# Patient Record
Sex: Male | Born: 1985 | Race: Black or African American | Hispanic: No | Marital: Single | Smoking: Current every day smoker
Health system: Southern US, Community
[De-identification: ages and names within clinical notes are randomized; demographics above are authoritative.]

## PROBLEM LIST (undated history)

## (undated) DIAGNOSIS — F419 Anxiety disorder, unspecified: Secondary | ICD-10-CM

## (undated) DIAGNOSIS — I10 Essential (primary) hypertension: Secondary | ICD-10-CM

## (undated) DIAGNOSIS — F32A Depression, unspecified: Secondary | ICD-10-CM

## (undated) HISTORY — PX: NO PAST SURGERIES: SHX2092

---
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
## 2005-09-03 ENCOUNTER — Ambulatory Visit: Payer: Self-pay | Admitting: Family Medicine

## 2006-09-02 DIAGNOSIS — I10 Essential (primary) hypertension: Secondary | ICD-10-CM

## 2006-09-02 DIAGNOSIS — E669 Obesity, unspecified: Secondary | ICD-10-CM | POA: Insufficient documentation

## 2010-06-10 ENCOUNTER — Emergency Department (HOSPITAL_COMMUNITY)
Admission: EM | Admit: 2010-06-10 | Discharge: 2010-06-10 | Payer: Self-pay | Source: Home / Self Care | Admitting: Emergency Medicine

## 2010-06-12 ENCOUNTER — Emergency Department (HOSPITAL_COMMUNITY)
Admission: EM | Admit: 2010-06-12 | Discharge: 2010-06-12 | Payer: Self-pay | Source: Home / Self Care | Admitting: Emergency Medicine

## 2015-04-07 ENCOUNTER — Encounter (HOSPITAL_BASED_OUTPATIENT_CLINIC_OR_DEPARTMENT_OTHER): Payer: Self-pay | Admitting: *Deleted

## 2015-04-07 ENCOUNTER — Emergency Department (HOSPITAL_BASED_OUTPATIENT_CLINIC_OR_DEPARTMENT_OTHER)
Admission: EM | Admit: 2015-04-07 | Discharge: 2015-04-07 | Discharge status: Against Medical Advice | Payer: Self-pay | Attending: Physician Assistant | Admitting: Physician Assistant

## 2015-04-07 DIAGNOSIS — Z720 Tobacco use: Secondary | ICD-10-CM | POA: Insufficient documentation

## 2015-04-07 DIAGNOSIS — E119 Type 2 diabetes mellitus without complications: Secondary | ICD-10-CM | POA: Insufficient documentation

## 2015-04-07 DIAGNOSIS — I10 Essential (primary) hypertension: Secondary | ICD-10-CM | POA: Insufficient documentation

## 2015-04-07 HISTORY — DX: Essential (primary) hypertension: I10

## 2015-04-07 LAB — COMPREHENSIVE METABOLIC PANEL
ALBUMIN: 3.6 g/dL (ref 3.5–5.0)
ALK PHOS: 73 U/L (ref 38–126)
ALT: 18 U/L (ref 17–63)
ANION GAP: 9 (ref 5–15)
AST: 17 U/L (ref 15–41)
BUN: 10 mg/dL (ref 6–20)
CALCIUM: 9 mg/dL (ref 8.9–10.3)
CO2: 29 mmol/L (ref 22–32)
Chloride: 96 mmol/L — ABNORMAL LOW (ref 101–111)
Creatinine, Ser: 1.04 mg/dL (ref 0.61–1.24)
GFR calc Af Amer: >60 mL/min (ref >60)
GFR calc non Af Amer: >60 mL/min (ref >60)
GLUCOSE: 465 mg/dL — AB (ref 65–99)
POTASSIUM: 4 mmol/L (ref 3.5–5.1)
SODIUM: 134 mmol/L — AB (ref 135–145)
Total Bilirubin: 0.5 mg/dL (ref 0.3–1.2)
Total Protein: 7.5 g/dL (ref 6.5–8.1)

## 2015-04-07 LAB — URINALYSIS, ROUTINE W REFLEX MICROSCOPIC
Bilirubin Urine: NEGATIVE
Glucose, UA: >1000 mg/dL — AB
Hgb urine dipstick: NEGATIVE
Ketones, ur: 15 mg/dL — AB
Leukocytes, UA: NEGATIVE
Nitrite: NEGATIVE
Protein, ur: NEGATIVE mg/dL
Specific Gravity, Urine: 1.044 — ABNORMAL HIGH (ref 1.005–1.030)
Urobilinogen, UA: 0.2 mg/dL (ref 0.0–1.0)
pH: 5.5 (ref 5.0–8.0)

## 2015-04-07 LAB — I-STAT VENOUS BLOOD GAS, ED
Acid-Base Excess: 1 mmol/L (ref 0.0–2.0)
Bicarbonate: 25.3 mEq/L — ABNORMAL HIGH (ref 20.0–24.0)
O2 Saturation: 72 %
PCO2 VEN: 37.2 mmHg — AB (ref 45.0–50.0)
PH VEN: 7.441 — AB (ref 7.250–7.300)
PO2 VEN: 36 mmHg (ref 30.0–45.0)
TCO2: 26 mmol/L (ref 0–100)

## 2015-04-07 LAB — URINE MICROSCOPIC-ADD ON

## 2015-04-07 LAB — CBC WITH DIFFERENTIAL/PLATELET
BASOS ABS: 0.1 10*3/uL (ref 0.0–0.1)
BASOS PCT: 1 %
EOS ABS: 0.1 10*3/uL (ref 0.0–0.7)
Eosinophils Relative: 1 %
HCT: 44.8 % (ref 39.0–52.0)
HEMOGLOBIN: 15.1 g/dL (ref 13.0–17.0)
Lymphocytes Relative: 25 %
Lymphs Abs: 1.9 10*3/uL (ref 0.7–4.0)
MCH: 29.8 pg (ref 26.0–34.0)
MCHC: 33.7 g/dL (ref 30.0–36.0)
MCV: 88.5 fL (ref 78.0–100.0)
MONOS PCT: 6 %
Monocytes Absolute: 0.5 10*3/uL (ref 0.1–1.0)
NEUTROS ABS: 5.1 10*3/uL (ref 1.7–7.7)
NEUTROS PCT: 67 %
Platelets: 224 10*3/uL (ref 150–400)
RBC: 5.06 MIL/uL (ref 4.22–5.81)
RDW: 12.8 % (ref 11.5–15.5)
WBC: 7.6 10*3/uL (ref 4.0–10.5)

## 2015-04-07 LAB — CBG MONITORING, ED
GLUCOSE-CAPILLARY: 408 mg/dL — AB (ref 65–99)
GLUCOSE-CAPILLARY: 314 mg/dL — AB (ref 65–99)

## 2015-04-07 MED ORDER — LIDOCAINE HCL (PF) 1 % IJ SOLN
5.0000 mL | Freq: Once | INTRAMUSCULAR | Status: DC
  Filled 2015-04-07: qty 5

## 2015-04-07 MED ORDER — SODIUM CHLORIDE 0.9 % IV BOLUS (SEPSIS)
1000.0000 mL | Freq: Once | INTRAVENOUS | Status: AC
  Administered 2015-04-07: 1000 mL via INTRAVENOUS

## 2015-04-07 NOTE — ED Notes (Signed)
Pt also c/o urgency and frequency, has noted a strong odor from urine also, no burning sensation noted per pt.

## 2015-04-07 NOTE — ED Notes (Signed)
Dr. Corlis Leak at [facility] with [place] for abscess I&D.

## 2015-04-07 NOTE — ED Notes (Signed)
Here for evaluation of "abscess" area per pt statement, at fold at LLQ of abd. Area noted to be red and slightly swollen in color, onset noted approx 3 days ago.

## 2015-04-07 NOTE — ED Notes (Signed)
RN notified of elevated glucose

## 2015-04-07 NOTE — ED Provider Notes (Signed)
CSN: [id number]     Arrival date & time 04/07/15  1330 History  By signing my name below, I, Terrance Branch, attest that this documentation has been prepared under the direction and in the presence of Adira Limburg Randall An, MD. Electronically Signed: Evon Slack, ED Scribe. 04/07/2015. 4:19 PM.     Chief Complaint  Patient presents with  . Abscess    The history is provided by the patient. No language interpreter was used.   HPI Comments: Jden Want is a 29 y.o. male who presents to the Emergency Department complaining of lower abdominal abscess onset 3 days prior. Pt presents with associated swelling and redness. Pt states that he normally squeezes the abscess or places a warm compress for relief. Pt also reports urinary urgency and frequency. Pt denies red streaking, fever, nausea or vomiting.    Past Medical History  Diagnosis Date  . Hypertension    History reviewed. No pertinent past surgical history. No family history on file. Social History  Substance Use Topics  . Smoking status: Current Every Day Smoker    Types: Cigarettes  . Smokeless tobacco: Never Used  . Alcohol Use: Yes     Comment: 1x week    Review of Systems  Constitutional: Negative for fever.  Gastrointestinal: Negative for nausea and vomiting.  Skin: Positive for wound (abscess to left panus. ).  All other systems reviewed and are negative.    Allergies  Review of patient's allergies indicates no known allergies.  Home Medications   Prior to Admission medications   Not on File   BP 146/89 mmHg  Pulse 76  Temp(Src) 98.4 F (36.9 C) (Oral)  Resp 20  SpO2 97%   Physical Exam  Constitutional: He is oriented to person, place, and time. He appears well-developed and well-nourished. No distress.  HENT:  Head: Normocephalic and atraumatic.  Eyes: Conjunctivae and EOM are normal.  Neck: Neck supple. No tracheal deviation present.  Cardiovascular: Normal rate, regular rhythm and normal  heart sounds.   No murmur heard. Pulmonary/Chest: Effort normal and breath sounds normal. No respiratory distress. He has no wheezes. He has no rales.  Abdominal:  Multiple less than 1 cm abscesses and surrounding erythema on panus on left side. multiple ares of induration but no discreet fluid palpated.   Musculoskeletal: Normal range of motion.  Neurological: He is alert and oriented to person, place, and time.  Skin: Skin is warm and dry.  Psychiatric: He has a normal mood and affect. His behavior is normal.  Nursing note and vitals reviewed.   ED Course  Procedures (including critical care time)  Labs Review Labs Reviewed  URINALYSIS, ROUTINE W REFLEX MICROSCOPIC (NOT AT [facility]) - Abnormal; Notable for the following:    Specific Gravity, Urine 1.044 (*)    Glucose, UA >1000 (*)    Ketones, ur 15 (*)    All other components within normal limits  COMPREHENSIVE METABOLIC PANEL - Abnormal; Notable for the following:    Sodium 134 (*)    Chloride 96 (*)    Glucose, Bld 465 (*)    All other components within normal limits  URINE MICROSCOPIC-ADD ON - Abnormal; Notable for the following:    Bacteria, UA MANY (*)    All other components within normal limits  CBG MONITORING, ED - Abnormal; Notable for the following:    Glucose-Capillary 408 (*)    All other components within normal limits  I-STAT VENOUS BLOOD GAS, ED - Abnormal; Notable for the following:  pH, Ven 7.441 (*)    pCO2, Ven 37.2 (*)    Bicarbonate 25.3 (*)    All other components within normal limits  URINE CULTURE  CBC WITH DIFFERENTIAL/PLATELET  BLOOD GAS, VENOUS  BETA-HYDROXYBUTYRIC ACID    Imaging Review No results found.    EKG Interpretation None      MDM   Final diagnoses:  None    Patient is 29 year old male presenting with abscesses. Patient has abscess on his pannus. It appears that he's had 14 or 16 of these are old scarring on his pannis. There is surrounding erythema. Patient also  complains of dysuria. Patient is obese and African-American. I'm concerned he may have diabetes. We'll do a glucose stick as well as I/d and abx for the abscesses.   4:49 PM Glucose shows a glucose of 409. We will get initial labs to rule out DKA. Patient has no primary care doctor.   7:46 PM  Patient having difficulty understanding the concept of diabetes. Would like to admit for diabetes education.  INCISION AND DRAINAGE Performed by: Arlana Hove Consent: Verbal consent obtained. Risks and benefits: risks, benefits and alternatives were discussed Type: abscess  Body area: left pannus  Anesthesia: local infiltration  Incision was made with a scalpel.  Local anesthetic: lidocaine1% w epinephrine  Anesthetic total: 5 ml  Complexity: complex Blunt dissection to break up loculations  Drainage: purulent  Drainage amount: copoius  Packing material: 1/4 in iodoform gauze  Patient tolerance: Patient tolerated the procedure well with no immediate complications.     I personally performed the services described in this documentation, which was scribed in my presence. The recorded information has been reviewed and is accurate.     Aristide Waggle Randall An, MD 04/07/15 2021

## 2015-04-07 NOTE — ED Notes (Signed)
Pt verbalizes wanting to go, planning to go, AMA explained, EDP aware, AMA signed, NSL removed. Pt leaving now despite much coaching, education and encouragement. Encouraged further to return if he "gets worse, changes his mind, has new sx, isn't getting better". Pt verbalizes understanding. Pt dressed self and walked out.

## 2015-04-07 NOTE — ED Notes (Signed)
Denies having fever, nausea or vomiting. States has had these areas before.

## 2015-04-07 NOTE — ED Notes (Signed)
Pt reports painful bump on abdomen. Also urinary frequency without pain

## 2015-04-08 LAB — BETA-HYDROXYBUTYRIC ACID: BETA-HYDROXYBUTYRIC ACID: 0.12 mmol/L (ref 0.05–0.27)

## 2015-04-09 LAB — URINE CULTURE: Special Requests: NORMAL

## 2021-04-11 ENCOUNTER — Encounter (HOSPITAL_COMMUNITY): Payer: Self-pay

## 2021-04-11 ENCOUNTER — Emergency Department (HOSPITAL_COMMUNITY)
Admission: EM | Admit: 2021-04-11 | Discharge: 2021-04-11 | Disposition: A | Discharge status: Home / Self Care | Payer: Self-pay | Attending: Emergency Medicine | Admitting: Emergency Medicine

## 2021-04-11 ENCOUNTER — Emergency Department (HOSPITAL_COMMUNITY): Payer: Self-pay

## 2021-04-11 DIAGNOSIS — J36 Peritonsillar abscess: Secondary | ICD-10-CM | POA: Insufficient documentation

## 2021-04-11 DIAGNOSIS — F17210 Nicotine dependence, cigarettes, uncomplicated: Secondary | ICD-10-CM | POA: Insufficient documentation

## 2021-04-11 DIAGNOSIS — J020 Streptococcal pharyngitis: Secondary | ICD-10-CM

## 2021-04-11 DIAGNOSIS — I10 Essential (primary) hypertension: Secondary | ICD-10-CM | POA: Insufficient documentation

## 2021-04-11 LAB — CBC WITH DIFFERENTIAL/PLATELET
Abs Immature Granulocytes: 0.06 10*3/uL (ref 0.00–0.07)
Basophils Absolute: 0 10*3/uL (ref 0.0–0.1)
Basophils Relative: 0 %
Eosinophils Absolute: 0 10*3/uL (ref 0.0–0.5)
Eosinophils Relative: 0 %
HCT: 46.5 % (ref 39.0–52.0)
Hemoglobin: 15.4 g/dL (ref 13.0–17.0)
Immature Granulocytes: 1 %
Lymphocytes Relative: 12 %
Lymphs Abs: 1.4 10*3/uL (ref 0.7–4.0)
MCH: 30.9 pg (ref 26.0–34.0)
MCHC: 33.1 g/dL (ref 30.0–36.0)
MCV: 93.4 fL (ref 80.0–100.0)
Monocytes Absolute: 0.7 10*3/uL (ref 0.1–1.0)
Monocytes Relative: 6 %
Neutro Abs: 9.6 10*3/uL — ABNORMAL HIGH (ref 1.7–7.7)
Neutrophils Relative %: 81 %
Platelets: 214 10*3/uL (ref 150–400)
RBC: 4.98 MIL/uL (ref 4.22–5.81)
RDW: 12.1 % (ref 11.5–15.5)
WBC: 11.7 10*3/uL — ABNORMAL HIGH (ref 4.0–10.5)
nRBC: 0 % (ref 0.0–0.2)

## 2021-04-11 LAB — BASIC METABOLIC PANEL
Anion gap: 9 (ref 5–15)
BUN: 22 mg/dL — ABNORMAL HIGH (ref 6–20)
CO2: 28 mmol/L (ref 22–32)
Calcium: 9.3 mg/dL (ref 8.9–10.3)
Chloride: 107 mmol/L (ref 98–111)
Creatinine, Ser: 1.25 mg/dL — ABNORMAL HIGH (ref 0.61–1.24)
GFR, Estimated: >60 mL/min (ref >60)
Glucose, Bld: 135 mg/dL — ABNORMAL HIGH (ref 70–99)
Potassium: 4.3 mmol/L (ref 3.5–5.1)
Sodium: 144 mmol/L (ref 135–145)

## 2021-04-11 MED ORDER — DEXAMETHASONE SODIUM PHOSPHATE 10 MG/ML IJ SOLN
10.0000 mg | Freq: Once | INTRAMUSCULAR | Status: AC
  Administered 2021-04-11: 10 mg via INTRAVENOUS
  Filled 2021-04-11: qty 1

## 2021-04-11 MED ORDER — SODIUM CHLORIDE (PF) 0.9 % IJ SOLN
INTRAMUSCULAR | Status: AC
  Filled 2021-04-11: qty 50

## 2021-04-11 MED ORDER — IOHEXOL 350 MG/ML SOLN
80.0000 mL | Freq: Once | INTRAVENOUS | Status: AC | PRN
  Administered 2021-04-11: 80 mL via INTRAVENOUS

## 2021-04-11 NOTE — ED Triage Notes (Signed)
Pt arrived via POV, c/o peritonsillar abcess. States was seen at urgent care today, dx with strep throat, told to go to ED for abscess drainage.

## 2021-04-11 NOTE — ED Provider Notes (Signed)
[facility]-EMERGENCY DEPT Provider Note   CSN: [id number] Arrival date & time: 04/11/21  0429     History Chief Complaint  Patient presents with   Sore Throat   Abscess    Randy Dunn is a 35 y.o. male.   Medical history of hypertension and obesity.  Presents with 6 days of sore throat.  He says it is severe 10 of 10 pain.  Hurts more on the right side.  Pain is constant.  Worse with swallowing.  Denies any fevers or chills.  Says he has difficulty breathing when lying flat.  No difficulty breathing when sitting up.  He was seen by urgent care yesterday.  They diagnosed him with a peritonsillar abscess with strep throat.  They told him he needed to have the abscess drained within 24 hours.  He was given IM Decadron and placed on a 10-day course of Augmentin.  He has a follow-up with ENT this afternoon at 1 PM, however patient presented to the emergency department because he does not think he is going to be able to make this appointment.   Sore Throat Pertinent negatives include no chest pain, no abdominal pain, no headaches and no shortness of breath.  Abscess Associated symptoms: no fever, no headaches, no nausea and no vomiting       Past Medical History:  Diagnosis Date   Hypertension     Patient Active Problem List   Diagnosis Date Noted   OBESITY, NOS 09/02/2006   HYPERTENSION, BENIGN SYSTEMIC 09/02/2006    History reviewed. No pertinent surgical history.     History reviewed. No pertinent family history.  Social History   Tobacco Use   Smoking status: Every Day    Types: Cigarettes   Smokeless tobacco: Never  Substance Use Topics   Alcohol use: Yes    Comment: 1x week   Drug use: Yes    Types: Marijuana    Home Medications Prior to Admission medications   Medication Sig Start Date End Date Taking? Authorizing Provider  amoxicillin (AMOXIL) 500 MG tablet Take 500 mg by mouth 2 (two) times daily.   Yes [provider]     Allergies    Patient has no known allergies.  Review of Systems   Review of Systems  Constitutional:  Negative for chills and fever.  HENT:  Positive for sore throat and trouble swallowing. Negative for congestion and rhinorrhea.   Eyes:  Negative for visual disturbance.  Respiratory:  Negative for cough, chest tightness and shortness of breath.   Cardiovascular:  Negative for chest pain, palpitations and leg swelling.  Gastrointestinal:  Negative for abdominal pain, blood in stool, constipation, diarrhea, nausea and vomiting.  Genitourinary:  Negative for dysuria, flank pain and hematuria.  Musculoskeletal:  Negative for back pain.  Skin:  Negative for rash and wound.  Neurological:  Negative for dizziness, syncope, weakness, light-headedness and headaches.  Psychiatric/Behavioral:  Negative for confusion.   All other systems reviewed and are negative.  Physical Exam Updated Vital Signs BP (!) 165/124 (BP Location: Left Arm)   Pulse (!) 59   Temp 98 F (36.7 C) (Oral)   Resp 16   SpO2 96%   Physical Exam Vitals and nursing note reviewed.  Constitutional:      General: He is not in acute distress.    Appearance: Normal appearance. He is well-developed. He is not ill-appearing, toxic-appearing or diaphoretic.  HENT:     Head: Normocephalic and atraumatic.  Jaw: No trismus or tenderness.     Nose: No nasal deformity, congestion or rhinorrhea.     Mouth/Throat:     Lips: Pink. No lesions.     Mouth: Mucous membranes are moist. No oral lesions.     Pharynx: Oropharynx is clear. Posterior oropharyngeal erythema present. No pharyngeal swelling, oropharyngeal exudate or uvula swelling.     Tonsils: No tonsillar exudate. 1+ on the right. 1+ on the left.     Comments: Uvula is deviated to the right at 90 degrees.  Tonsils do not appear overtly swollen.  I did not see peritonsillar abscess on exam.  Erythema to pharynx. Eyes:     General: Gaze aligned appropriately. No  scleral icterus.       Right eye: No discharge.        Left eye: No discharge.     Conjunctiva/sclera: Conjunctivae normal.     Right eye: Right conjunctiva is not injected. No exudate or hemorrhage.    Left eye: Left conjunctiva is not injected. No exudate or hemorrhage. Neck:     Trachea: Trachea normal.     Comments: Some right-sided tenderness to the right anterior neck. Pulmonary:     Effort: Pulmonary effort is normal. No respiratory distress.  Lymphadenopathy:     Cervical: Cervical adenopathy present.  Skin:    General: Skin is warm and dry.  Neurological:     Mental Status: He is alert and oriented to person, place, and time.  Psychiatric:        Mood and Affect: Mood normal.        Speech: Speech normal.        Behavior: Behavior normal. Behavior is cooperative.    ED Results / Procedures / Treatments   Labs (all labs ordered are listed, but only abnormal results are displayed) Labs Reviewed  CBC WITH DIFFERENTIAL/PLATELET - Abnormal; Notable for the following components:      Result Value   WBC 11.7 (*)    Neutro Abs 9.6 (*)    All other components within normal limits  BASIC METABOLIC PANEL - Abnormal; Notable for the following components:   Glucose, Bld 135 (*)    BUN 22 (*)    Creatinine, Ser 1.25 (*)    All other components within normal limits    EKG None  Radiology CT Soft Tissue Neck W Contrast  Result Date: 04/11/2021 CLINICAL DATA:  Peritonsillar abscess. Patient seen in urgent care and sent to emergency room for drainage of abscess. Strep throat EXAM: CT NECK WITH CONTRAST TECHNIQUE: Multidetector CT imaging of the neck was performed using the standard protocol following the bolus administration of intravenous contrast. CONTRAST:  12mL OMNIPAQUE IOHEXOL 350 MG/ML SOLN COMPARISON:  None. FINDINGS: Pharynx and larynx: Adenoid tissue is hypertrophied without discrete lesion. An ill-defined hypoattenuating area in or adjacent to the right palatine tonsil  measures 7 x 11 x 10 mm. Instinct an incomplete peripheral enhancement is associated. The palatine tonsils are enlarged bilaterally, right greater than left. The airway is narrowed, but patent. Stranding is present in the right parapharyngeal fat. Lingual tonsils are diffusely enlarged. Vallecular is within normal limits. Aryepiglottic folds and piriform sinuses are clear. Vocal cords are midline and symmetric. Trachea is clear. Salivary glands: The submandibular and parotid glands and ducts are within normal limits. Thyroid: Normal Lymph nodes: Enlarged level 2 lymph nodes are present bilaterally, right greater than left. These are likely reactive. Vascular: Normal Limited intracranial: Within normal limits. Visualized orbits: Remote right  orbital blowout fracture is present. Globes and orbits are otherwise within normal limits. Mastoids and visualized paranasal sinuses: The paranasal sinuses and mastoid air cells are clear. Skeleton: Vertebral body heights and alignment are normal. No focal lytic or blastic lesions are present. Upper chest: The lung apices are clear. Thoracic inlet is within normal limits. IMPRESSION: 1. Enlarged palatine tonsils and lingual tonsils compatible with acute pharyngitis. 2. 7 x 11 x 10 mm hypoattenuating area in or adjacent to the right palatine tonsil is concerning for a developing abscess. No definite drainable abscess is yet present. 3. Narrowing of the airway is narrowed, but patent. 4. Enlarged level 2 lymph nodes bilaterally, right greater than left, are likely reactive. 5. Remote right orbital blowout fracture. Electronically Signed   By: Marin Roberts M.D.   On: 04/11/2021 09:55    Procedures Procedures   Medications Ordered in ED Medications  sodium chloride (PF) 0.9 % injection (has no administration in time range)  dexamethasone (DECADRON) injection 10 mg (has no administration in time range)  iohexol (OMNIPAQUE) 350 MG/ML injection 80 mL (80 mLs Intravenous  Contrast Given 04/11/21 0849)    ED Course  I have reviewed the triage vital signs and the nursing notes.  Pertinent labs & imaging results that were available during my care of the patient were reviewed by me and considered in my medical decision making (see chart for details).  Clinical Course as of 04/11/21 1014  Fri Apr 11, 2021  0700 Came from Urgent Care. He was given Decadron injection and Antibiotic course of Augmentin. Strep + [GL]  0813 Potassium: 4.3 [GL]    Clinical Course User Index [GL] Yohana Bartha, Finis Bud, PA-C   MDM Rules/Calculators/A&P                          Patient is a 35 year old male who presents to the emergency department with sore throat for 6 days.  He was diagnosed with a peritonsillar abscess as well strep throat at an urgent care.  You sent to the emergency department for drainage. He is afebrile and his vitals are stable. On my exam, he does have 90 degree uvular deviation to the right, however I do not see an obvious peritonsillar abscess.  He does not have any trismus.  He does not have a potato voice.  He is afebrile. I reviewed his chart that he showed me on his phone for the urgent care office.  He had already been given Decadron injection.  He is started on Augmentin and treat his strep throat and abscess.  I will obtain CBC and BMP.  We will also obtain CT soft tissue neck.  CBC is with mild leukocytosis of 11.7.  Creatinine mildly elevated at 1.25.  Labs are otherwise unremarkable.  CT scan with 7 x 11 x 10 mm hypoattenuating area in the right palatine tonsil which is concerning for developing abscess.  Abscess is not large enough to be drained.  We will give an additional 10 mg of Decadron prior to discharge.  Patient to continue the Augmentin that was prescribed by the urgent care.  He needs to follow-up with ENT I provided that referral.  Plan was discussed with the patient who agrees and acknowledges the next steps.   Final Clinical Impression(s) /  ED Diagnoses Final diagnoses:  Peritonsillar abscess  Strep pharyngitis    Rx / DC Orders ED Discharge Orders  Ordered    Ambulatory referral to ENT        04/11/21 1008             Therese Sarah 04/11/21 1014    Mancel Bale, MD 04/12/21 1212

## 2021-04-11 NOTE — Discharge Instructions (Signed)
Been seen in the emergency department for strep pharyngitis and peritonsillar abscess.  You need to complete the Augmentin antibiotic course that was prescribed to you by the urgent care.  If you are unable to attend the ENT appointment that you have this afternoon, I provided a separate ENT referral for you.  If you do not hear from them within 1-2 days, please reach out to them.

## 2022-03-11 ENCOUNTER — Encounter (HOSPITAL_BASED_OUTPATIENT_CLINIC_OR_DEPARTMENT_OTHER): Payer: Self-pay | Admitting: Emergency Medicine

## 2022-03-11 ENCOUNTER — Other Ambulatory Visit: Payer: Self-pay

## 2022-03-11 ENCOUNTER — Emergency Department (HOSPITAL_BASED_OUTPATIENT_CLINIC_OR_DEPARTMENT_OTHER)
Admission: EM | Admit: 2022-03-11 | Discharge: 2022-03-11 | Disposition: A | Discharge status: Home / Self Care | Payer: Self-pay | Attending: Emergency Medicine | Admitting: Emergency Medicine

## 2022-03-11 ENCOUNTER — Telehealth (HOSPITAL_BASED_OUTPATIENT_CLINIC_OR_DEPARTMENT_OTHER): Payer: Self-pay | Admitting: *Deleted

## 2022-03-11 ENCOUNTER — Ambulatory Visit (HOSPITAL_BASED_OUTPATIENT_CLINIC_OR_DEPARTMENT_OTHER): Payer: Self-pay | Admitting: Family

## 2022-03-11 DIAGNOSIS — I10 Essential (primary) hypertension: Secondary | ICD-10-CM | POA: Insufficient documentation

## 2022-03-11 DIAGNOSIS — Z79899 Other long term (current) drug therapy: Secondary | ICD-10-CM | POA: Insufficient documentation

## 2022-03-11 LAB — COMPREHENSIVE METABOLIC PANEL
ALT: 15 U/L (ref 0–44)
AST: 18 U/L (ref 15–41)
Albumin: 4.1 g/dL (ref 3.5–5.0)
Alkaline Phosphatase: 36 U/L — ABNORMAL LOW (ref 38–126)
Anion gap: 8 (ref 5–15)
BUN: 14 mg/dL (ref 6–20)
CO2: 26 mmol/L (ref 22–32)
Calcium: 9.2 mg/dL (ref 8.9–10.3)
Chloride: 104 mmol/L (ref 98–111)
Creatinine, Ser: 0.94 mg/dL (ref 0.61–1.24)
GFR, Estimated: >60 mL/min (ref >60)
Glucose, Bld: 146 mg/dL — ABNORMAL HIGH (ref 70–99)
Potassium: 3.5 mmol/L (ref 3.5–5.1)
Sodium: 138 mmol/L (ref 135–145)
Total Bilirubin: 0.6 mg/dL (ref 0.3–1.2)
Total Protein: 7.3 g/dL (ref 6.5–8.1)

## 2022-03-11 LAB — CBC WITH DIFFERENTIAL/PLATELET
Abs Immature Granulocytes: 0.01 10*3/uL (ref 0.00–0.07)
Basophils Absolute: 0 10*3/uL (ref 0.0–0.1)
Basophils Relative: 0 %
Eosinophils Absolute: 0.1 10*3/uL (ref 0.0–0.5)
Eosinophils Relative: 1 %
HCT: 44.1 % (ref 39.0–52.0)
Hemoglobin: 15.1 g/dL (ref 13.0–17.0)
Immature Granulocytes: 0 %
Lymphocytes Relative: 35 %
Lymphs Abs: 1.8 10*3/uL (ref 0.7–4.0)
MCH: 30.3 pg (ref 26.0–34.0)
MCHC: 34.2 g/dL (ref 30.0–36.0)
MCV: 88.6 fL (ref 80.0–100.0)
Monocytes Absolute: 0.3 10*3/uL (ref 0.1–1.0)
Monocytes Relative: 5 %
Neutro Abs: 3 10*3/uL (ref 1.7–7.7)
Neutrophils Relative %: 59 %
Platelets: 194 10*3/uL (ref 150–400)
RBC: 4.98 MIL/uL (ref 4.22–5.81)
RDW: 12.5 % (ref 11.5–15.5)
WBC: 5.2 10*3/uL (ref 4.0–10.5)
nRBC: 0 % (ref 0.0–0.2)

## 2022-03-11 MED ORDER — AMLODIPINE BESYLATE 5 MG PO TABS: 5.0000 mg | ORAL_TABLET | Freq: Every day | ORAL | 0 refills | Status: DC

## 2022-03-11 NOTE — ED Provider Notes (Signed)
[facility] EMERGENCY DEPT Provider Note   CSN: [id number] Arrival date & time: 03/11/22  0940     History  Chief Complaint  Patient presents with   Hypertension    Randy Dunn is a 36 y.o. male.  HPI     36 year old male comes in with chief complaint of elevated blood pressure. 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  He sat in the clinic for a few minutes and his blood pressure came down.  Thereafter he went to a weight loss clinic 2 days ago and his blood pressure was 190/140.  He was advised to come to the ER.  He denies any chest pain, shortness of breath, headaches.  Patient has never been diagnosed with hypertension and has never taken BP medications.  He denies any heavy smoking, drinking, substance use disorder.  He did lose 100 pounds at 1 point, but regained 40 of it, prompting him to visit the weight loss clinic.  Home Medications Prior to Admission medications   Medication Sig Start Date End Date Taking? Authorizing Provider  amLODipine (NORVASC) 5 MG tablet Take 1 tablet (5 mg total) by mouth daily. 03/11/22  Yes Derwood Kaplan, MD  amoxicillin (AMOXIL) 500 MG tablet Take 500 mg by mouth 2 (two) times daily.    [provider]      Allergies    Patient has no known allergies.    Review of Systems   Review of Systems  Physical Exam Updated Vital Signs BP (!) 166/94   Pulse 65   Temp 97.7 F (36.5 C) (Temporal)   Resp 13   Ht 5\' 8"  (1.727 m)   Wt 136.1 kg   SpO2 98%   BMI 45.61 kg/m  Physical Exam Vitals and nursing note reviewed.  Constitutional:      Appearance: He is well-developed.  HENT:     Head: Atraumatic.  Cardiovascular:     Rate and Rhythm: Normal rate.  Pulmonary:     Effort: Pulmonary effort is normal.  Musculoskeletal:     Cervical back: Neck supple.  Skin:    General: Skin is warm.  Neurological:     Mental Status: He is alert and oriented  to person, place, and time.     ED Results / Procedures / Treatments   Labs (all labs ordered are listed, but only abnormal results are displayed) Labs Reviewed  COMPREHENSIVE METABOLIC PANEL - Abnormal; Notable for the following components:      Result Value   Glucose, Bld 146 (*)    Alkaline Phosphatase 36 (*)    All other components within normal limits  CBC WITH DIFFERENTIAL/PLATELET    EKG EKG Interpretation  Date/Time:  Wednesday March 11 2022 11:21:49 EDT Ventricular Rate:  68 PR Interval:  154 QRS Duration: 95 QT Interval:  397 QTC Calculation: 423 R Axis:   90 Text Interpretation: Sinus rhythm Prominent P waves, nondiagnostic Borderline right axis deviation No acute changes No old tracing to compare Confirmed by 04-08-2006 [phone]) on 03/11/2022 12:05:38 PM  Radiology No results found.  Procedures Procedures    Medications Ordered in ED Medications - No data to display  ED Course/ Medical Decision Making/ A&P                           Medical Decision Making Amount and/or Complexity of Data Reviewed Labs: ordered.  Risk Prescription drug  management.   This patient presents to the ED with chief complaint(s) of elevated blood pressure, asymptomatic.  The initial plan is to get CBC, BMP and EKG. Most likely this will be a new diagnosis of blood pressure.  Unfortunately patient does not have a PCP.   Independent labs interpretation:  The following labs were independently interpreted: CBC, BMP are reassuring.  No renal failure. Blood glucose is slightly high, but he has not been fasting.   Treatment and Reassessment: Results discussed with the patient.  We will start him on amlodipine 5 mg. Given that he does not have a PCP, will have him follow-up with accelerated hypertension clinic associated with [place]. We have discussed with him some lifestyle changes as well.  Blood pressure log along with instructions on how to take blood pressure  and when to take it have been provided.  Social Determinants of health: Lack of PCP  Final Clinical Impression(s) / ED Diagnoses Final diagnoses:  Essential hypertension    Rx / DC Orders ED Discharge Orders          Ordered    amLODipine (NORVASC) 5 MG tablet  Daily        03/11/22 1201              Derwood Kaplan, MD 03/11/22 1207

## 2022-03-11 NOTE — ED Triage Notes (Signed)
Pt arrives to ED with c/o hypertension. He reports over the past two days his blood pressure has been elevated up to 190 systolic.

## 2022-03-11 NOTE — Telephone Encounter (Signed)
Randy Si, MD  Burnell Blanks, LPN; Derwood Kaplan, MD Juliette Alcide,   Can we please get him in ADV HTN clinic with [facility]?   Thanks!   Patient scheduled with Ronn Melena NP 10/12 Discussed HTN with patient who verbalized not wanting to be on medication Strongly encourage patient to take Amlodipine as prescribed and suggested reviewing DASH diet

## 2022-03-11 NOTE — ED Notes (Signed)
Patient verbalizes understanding of discharge instructions. Opportunity for questioning and answers were provided. Patient discharged from ED.  °

## 2022-03-11 NOTE — Discharge Instructions (Addendum)
You are seen in the ER for elevated blood pressure.  Please start taking the medication that is prescribed.  Read the instructions on the blood pressure to understand the side effects.  Please purchase a blood pressure machine and check your blood pressure every day, twice a day.  Check the blood pressure forcing in the morning before you have had any caffeine intake and then lasting before you go to bed.  On each occasion you should be completely relaxed.  Record your blood pressure and take that sheet to the clinic when you get your appointment.  Provided is the phone number for Dr. Duke Salvia, who runs the hypertension clinic.

## 2022-04-16 ENCOUNTER — Encounter (HOSPITAL_BASED_OUTPATIENT_CLINIC_OR_DEPARTMENT_OTHER): Payer: Self-pay | Admitting: Family

## 2022-04-16 ENCOUNTER — Ambulatory Visit (INDEPENDENT_AMBULATORY_CARE_PROVIDER_SITE_OTHER): Payer: Self-pay | Admitting: Family

## 2022-04-16 VITALS — BP 142/101 | HR 76 | Ht 68.0 in | Wt 310.0 lb

## 2022-04-16 DIAGNOSIS — Z006 Encounter for examination for normal comparison and control in clinical research program: Secondary | ICD-10-CM

## 2022-04-16 DIAGNOSIS — Z6841 Body Mass Index (BMI) 40.0 and over, adult: Secondary | ICD-10-CM

## 2022-04-16 DIAGNOSIS — R0683 Snoring: Secondary | ICD-10-CM

## 2022-04-16 DIAGNOSIS — Z599 Problem related to housing and economic circumstances, unspecified: Secondary | ICD-10-CM

## 2022-04-16 DIAGNOSIS — G4730 Sleep apnea, unspecified: Secondary | ICD-10-CM

## 2022-04-16 DIAGNOSIS — I10 Essential (primary) hypertension: Secondary | ICD-10-CM

## 2022-04-16 MED ORDER — LOSARTAN POTASSIUM 50 MG PO TABS: 50.0000 mg | ORAL_TABLET | Freq: Every day | ORAL | 2 refills | Status: AC

## 2022-04-16 NOTE — Research (Signed)
  Subject Name: Randy Dunn met inclusion and exclusion criteria for the Virtual Care and Social Determinant Interventions for the management of hypertension trial.  The informed consent form, study requirements and expectations were reviewed with the subject by Dr. Oval Linsey and myself. The subject was given the opportunity to read the consent and ask questions. The subject verbalized understanding of the trial requirements.  All questions were addressed prior to the signing of the consent form. The subject agreed to participate in the trial and signed the informed consent. The informed consent was obtained prior to performance of any protocol-specific procedures for the subject.  A copy of the signed informed consent was given to the subject and a copy was placed in the subject's medical record.  Randy Dunn was randomized to Group 2.

## 2022-04-16 NOTE — Progress Notes (Signed)
Advanced [facility]pert[facility] Initial Assessment:    Date:  04/19/2022   ID:  Randy Dunn, DOB 1986-06-23, MRN [id number]  PCP:  Patient, No Pcp Per  Cardiologist:  None  Nephrologist:  Referring MD: No ref. provider found   CC: Hypertension  History of Present Illness:    Randy Dunn is a 36 y.o. male with a hx of hypertension, obesity here to establish care in the Advanced Hypertension Clinic.   He had ED eval 03/11/22 due to hypertension. Noted elevated BP at visit earlier that week for DOT physical as well as at weight loss clinic. Had never taken antihypertensives. In ED was prescribed Amlodipine 5mg  QD. Noted feeling groggy and ED while taking. Previous weight loss of 100 pounds but has regained 30 lbs. Since leaving BP has been 140s/90s at home. Has been out of Amlodipine for a few days.   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 He owns his own truck and has been out of work as truck presently broken. Does not have insurance. Has full custody of his 22 year old daughter who enjoy splaying tennis.   he reports tobacco use daily 0.5 PPD. For exercise he walks at the gym at his apartment complex. he eats at home and does follow low sodium diet.   Notes some chest discomfort described as pressure but not pain. One day occurred while running other times has been more attributed to what he eats. No prior treatment for GERD. He does snore and often wake up feeling fatigued. No prior sleep study.   No palpitations, dyspnea on exertion, orthopnea, PND, edema.   Previous antihypertensives: Amlodipine: groggy, ED, stomach discomfort  Past Medical History:  Diagnosis Date   Hypertension     No past surgical history on file.  Current Medications: Current Meds  Medication Sig   losartan (COZAAR) 50 MG tablet Take 1 tablet (50 mg total) by mouth daily.   [DISCONTINUED] amLODipine (NORVASC) 5 MG tablet Take 1 tablet (5 mg total) by mouth daily.     Allergies:    Patient has no known allergies.   Social History   Socioeconomic History   Marital status: Single    Spouse name: Not on file   Number of children: Not on file   Years of education: Not on file   Highest education level: Not on file  Occupational History   Not on file  Tobacco Use   Smoking status: Every Day    Types: Cigarettes   Smokeless tobacco: Never  Substance and Sexual Activity   Alcohol use: Yes    Comment: 1x week   Drug use: Yes    Types: Marijuana   Sexual activity: Not on file  Other Topics Concern   Not on file  Social History Narrative   Not on file   Social Determinants of Health   Financial Resource Strain: Medium Risk (04/16/2022)   Overall Financial Resource Strain (CARDIA)    Difficulty of Paying Living Expenses: Somewhat hard  Food Insecurity: No Food Insecurity (04/16/2022)   Hunger Vital Sign    Worried About Running Out of Food in the Last Year: Never true    [place] in the Last Year: Never true  Transportation Needs: Unmet Transportation Needs (04/16/2022)   PRAPARE - Transportation    Lack of Transportation (Medical): Yes    Lack of Transportation (Non-Medical): Yes  Physical Activity: Insufficiently Active (04/16/2022)   Exercise Vital Sign    Days of Exercise  per Week: 4 days    Minutes of Exercise per Session: 20 min  Stress: Not on file  Social Connections: Not on file     Family History: The patient's family history includes Hypertension in his mother.  ROS:   Please see the history of present illness.     All other systems reviewed and are negative.  EKGs/Labs/Other Studies Reviewed:    EKG:  No EKG today  Recent Labs: 03/11/2022: ALT 15; BUN 14; Creatinine, Ser 0.94; Hemoglobin 15.1; Platelets 194; Potassium 3.5; Sodium 138   Recent Lipid Panel No results found for: "CHOL", "TRIG", "HDL", "CHOLHDL", "VLDL", "LDLCALC", "LDLDIRECT"  Physical Exam:   VS:  BP (!) 142/101 Comment: right arm  Pulse 76   Ht 5\' 8"   (1.727 m)   Wt (!) 310 lb (140.6 kg)   BMI 47.14 kg/m  , BMI Body mass index is 47.14 kg/m. GENERAL:  Well appearing, overweight HEENT: Pupils equal round and reactive, fundi not visualized, oral mucosa unremarkable NECK:  No jugular venous distention, waveform within normal limits, carotid upstroke brisk and symmetric, no bruits, no thyromegaly LYMPHATICS:  No cervical adenopathy LUNGS:  Clear to auscultation bilaterally HEART:  RRR.  PMI not displaced or sustained,S1 and S2 within normal limits, no S3, no S4, no clicks, no rubs, no murmurs ABD:  Flat, positive bowel sounds normal in frequency in pitch, no bruits, no rebound, no guarding, no midline pulsatile mass, no hepatomegaly, no splenomegaly EXT:  2 plus pulses throughout, no edema, no cyanosis no clubbing SKIN:  No rashes no nodules NEURO:  Cranial nerves II through XII grossly intact, motor grossly intact throughout PSYCH:  Cognitively intact, oriented to person place and time   ASSESSMENT/PLAN:    HTN - BP not at goal <130/80. Did not tolerate Amlodipine well (ED, abdominal discomfort, groggy). Start Losartan 50mg  QD - to get at [facility] for $9. Labs in 1 week BMP, TSH. Heart healthy diet and regular cardiovascular exercise encouraged.  Refer to PREP exercise program. Enrolled in ADV HTN research study.   Snores / Sleep disordered breathing - Notes snoring and non-restorative sleep. Ideally would get sleep study, but cost prohibitive with lack of insurance. Refer to SW.   Obesity - Weight loss via diet and exercise encouraged. Discussed the impact being overweight would have on cardiovascular risk. Refer to PREP exercise program. Ideally would be on GLP1, but cost prohibitive with no insurance.   Financial difficulties - Presently out of work as truck is broken. No insurance. Discussed using [facility]. Refer to SW for financial resources.   Screening for Secondary Hypertension:     04/19/2022   12:47 PM  Causes  Sleep Apnea  Not Screened     - Comments needs sleep study - cost prohibitive  Thyroid Disease Screened  Compliance Screened    Relevant Labs/Studies:    Latest Ref Rng & Units 03/11/2022   10:12 AM 04/11/2021    7:46 AM 04/07/2015    5:05 PM  Basic Labs  Sodium 135 - 145 mmol/L 138  144  134   Potassium 3.5 - 5.1 mmol/L 3.5  4.3  4.0   Creatinine 0.61 - 1.24 mg/dL 06/11/2021  06/07/2015  2.62                     he consents to be monitored in our remote patient monitoring program through [employer].  he will track his blood pressure twice daily and understands that these trends will help  [place] to adjust his medications as needed prior to his next appointment.  he  interested in enrolling in the PREP exercise and nutrition program through the [facility].    Disposition:    FU with APP/PharmD in 2 months and MD in 4 months.   Medication Adjustments/Labs and Tests Ordered: Current medicines are reviewed at length with the patient today.  Concerns regarding medicines are outlined above.  Orders Placed This Encounter  Procedures   Basic metabolic panel   TSH   Amb Referral To Provider Referral Exercise Program (P.R.E.P)   Referral to HRT/VAS Care Navigation   Cantril's Ladder Assessment   Meds ordered this encounter  Medications   losartan (COZAAR) 50 MG tablet    Sig: Take 1 tablet (50 mg total) by mouth daily.    Dispense:  30 tablet    Refill:  2    Signed, Alver Sorrow, NP  04/19/2022 12:47 PM    [facility]

## 2022-04-16 NOTE — Patient Instructions (Addendum)
Medication Instructions:  Your physician has recommended you make the following change in your medication:   Stop: amlodipine   Start: Losartan 50mg - Retail Price at [employer] is $9   Labwork: Please return for Lab work in one week for BMP and TSH. You may come to the...   [facility] (3rd floor) [address] [place], [place] [address]  Open: 8am-Noon and 1pm-4:30pm  Please ring the doorbell on the small table when you exit the elevator and the Lab Tech will come get you  [place] [facility] [address], [place], [place] [address] Open: 8am-1pm, then 2pm-4:30pm   [place]- Please see attached locations sheet stapled to your lab work with address and hours.   Follow-Up: 2 month follow up: 12/14 AT 1:30PM WITH CAITLIN WALKER, NP  4 month follow up: 2/5 AT 2PM WITH DR. Irwin    Referrals:  We have referred you to PREP today, they will reach out to you to get you started.   We have also referred you over to social work today to help with fiancial burdens of no insurance.    Special Instructions:  DASH Eating Plan DASH stands for Dietary Approaches to Stop Hypertension. The DASH eating plan is a healthy eating plan that has been shown to: Reduce high blood pressure (hypertension). Reduce your risk for type 2 diabetes, heart disease, and stroke. Help with weight loss. What are tips for following this plan? Reading food labels Check food labels for the amount of salt (sodium) per serving. Choose foods with less than 5 percent of the Daily Value of sodium. Generally, foods with less than 300 milligrams (mg) of sodium per serving fit into this eating plan. To find whole grains, look for the word "whole" as the first word in the ingredient list. Shopping Buy products labeled as "low-sodium" or "no salt added." Buy fresh foods. Avoid canned foods and pre-made or frozen meals. Cooking Avoid adding salt when cooking. Use  salt-free seasonings or herbs instead of table salt or sea salt. Check with your health care provider or pharmacist before using salt substitutes. Do not fry foods. Cook foods using healthy methods such as baking, boiling, grilling, roasting, and broiling instead. Cook with heart-healthy oils, such as olive, canola, avocado, soybean, or sunflower oil. Meal planning  Eat a balanced diet that includes: 4 or more servings of fruits and 4 or more servings of vegetables each day. Try to fill one-half of your plate with fruits and vegetables. 6-8 servings of whole grains each day. Less than 6 oz (170 g) of lean meat, poultry, or fish each day. A 3-oz (85-g) serving of meat is about the same size as a deck of cards. One egg equals 1 oz (28 g). 2-3 servings of low-fat dairy each day. One serving is 1 cup (237 mL). 1 serving of nuts, seeds, or beans 5 times each week. 2-3 servings of heart-healthy fats. Healthy fats called omega-3 fatty acids are found in foods such as walnuts, flaxseeds, fortified milks, and eggs. These fats are also found in cold-water fish, such as sardines, salmon, and mackerel. Limit how much you eat of: Canned or prepackaged foods. Food that is high in trans fat, such as some fried foods. Food that is high in saturated fat, such as fatty meat. Desserts and other sweets, sugary drinks, and other foods with added sugar. Full-fat dairy products. Do not salt foods before eating. Do not eat more than 4 egg yolks  a week. Try to eat at least 2 vegetarian meals a week. Eat more home-cooked food and less restaurant, buffet, and fast food. Lifestyle When eating at a restaurant, ask that your food be prepared with less salt or no salt, if possible. If you drink alcohol: Limit how much you use to: 0-1 drink a day for women who are not pregnant. 0-2 drinks a day for men. Be aware of how much alcohol is in your drink. In the [place] one drink equals one 12 oz bottle of beer (355 mL), one 5  oz glass of wine (148 mL), or one 1 oz glass of hard liquor (44 mL). General information Avoid eating more than 2,300 mg of salt a day. If you have hypertension, you may need to reduce your sodium intake to 1,500 mg a day. Work with your health care provider to maintain a healthy body weight or to lose weight. Ask what an ideal weight is for you. Get at least 30 minutes of exercise that causes your heart to beat faster (aerobic exercise) most days of the week. Activities may include walking, swimming, or biking. Work with your health care provider or dietitian to adjust your eating plan to your individual calorie needs. What foods should I eat? Fruits All fresh, dried, or frozen fruit. Canned fruit in natural juice (without added sugar). Vegetables Fresh or frozen vegetables (raw, steamed, roasted, or grilled). Low-sodium or reduced-sodium tomato and vegetable juice. Low-sodium or reduced-sodium tomato sauce and tomato paste. Low-sodium or reduced-sodium canned vegetables. Grains Whole-grain or whole-wheat bread. Whole-grain or whole-wheat pasta. Brown rice. [place]. Bulgur. Whole-grain and low-sodium cereals. Pita bread. Low-fat, low-sodium crackers. Whole-wheat flour tortillas. Meats and other proteins Skinless chicken or [place]. Ground chicken or [place]. Pork with fat trimmed off. Fish and seafood. Egg whites. Dried beans, peas, or lentils. Unsalted nuts, nut butters, and seeds. Unsalted canned beans. Lean cuts of beef with fat trimmed off. Low-sodium, lean precooked or cured meat, such as sausages or meat loaves. Dairy Low-fat (1%) or fat-free (skim) milk. Reduced-fat, low-fat, or fat-free cheeses. Nonfat, low-sodium ricotta or cottage cheese. Low-fat or nonfat yogurt. Low-fat, low-sodium cheese. Fats and oils Soft margarine without trans fats. Vegetable oil. Reduced-fat, low-fat, or light mayonnaise and salad dressings (reduced-sodium). Canola, safflower, olive, avocado, soybean, and  sunflower oils. Avocado. Seasonings and condiments Herbs. Spices. Seasoning mixes without salt. Other foods Unsalted popcorn and pretzels. Fat-free sweets. The items listed above may not be a complete list of foods and beverages you can eat. Contact a dietitian for more information. What foods should I avoid? Fruits Canned fruit in a light or heavy syrup. Fried fruit. Fruit in cream or butter sauce. Vegetables Creamed or fried vegetables. Vegetables in a cheese sauce. Regular canned vegetables (not low-sodium or reduced-sodium). Regular canned tomato sauce and paste (not low-sodium or reduced-sodium). Regular tomato and vegetable juice (not low-sodium or reduced-sodium). Angie Fava. Olives. Grains Baked goods made with fat, such as croissants, muffins, or some breads. Dry pasta or rice meal packs. Meats and other proteins Fatty cuts of meat. Ribs. Fried meat. Berniece Salines. Bologna, salami, and other precooked or cured meats, such as sausages or meat loaves. Fat from the back of a pig (fatback). Bratwurst. Salted nuts and seeds. Canned beans with added salt. Canned or smoked fish. Whole eggs or egg yolks. Chicken or [place] with skin. Dairy Whole or 2% milk, cream, and half-and-half. Whole or full-fat cream cheese. Whole-fat or sweetened yogurt. Full-fat cheese. Nondairy creamers. Whipped toppings. Processed cheese and  cheese spreads. Fats and oils Butter. Stick margarine. Lard. Shortening. Ghee. Bacon fat. Tropical oils, such as coconut, palm kernel, or palm oil. Seasonings and condiments Onion salt, garlic salt, seasoned salt, table salt, and sea salt. Worcestershire sauce. Tartar sauce. Barbecue sauce. Teriyaki sauce. Soy sauce, including reduced-sodium. Steak sauce. Canned and packaged gravies. Fish sauce. Oyster sauce. Cocktail sauce. Store-bought horseradish. Ketchup. Mustard. Meat flavorings and tenderizers. Bouillon cubes. Hot sauces. Pre-made or packaged marinades. Pre-made or packaged taco  seasonings. Relishes. Regular salad dressings. Other foods Salted popcorn and pretzels. The items listed above may not be a complete list of foods and beverages you should avoid. Contact a dietitian for more information. Where to find more information National Heart, Lung, and Blood Institute: https://Billi Bright-eaton.com/ American Heart Association: www.heart.org Academy of Nutrition and Dietetics: www.eatright.[place]: www.kidney.org Summary The DASH eating plan is a healthy eating plan that has been shown to reduce high blood pressure (hypertension). It may also reduce your risk for type 2 diabetes, heart disease, and stroke. When on the DASH eating plan, aim to eat more fresh fruits and vegetables, whole grains, lean proteins, low-fat dairy, and heart-healthy fats. With the DASH eating plan, you should limit salt (sodium) intake to 2,300 mg a day. If you have hypertension, you may need to reduce your sodium intake to 1,500 mg a day. Work with your health care provider or dietitian to adjust your eating plan to your individual calorie needs. This information is not intended to replace advice given to you by your health care provider. Make sure you discuss any questions you have with your health care provider. Document Revised: 05/26/2019 Document Reviewed: 05/26/2019 Elsevier Patient Education  [place].

## 2022-04-17 ENCOUNTER — Telehealth: Payer: Self-pay | Admitting: Licensed Clinical Social Worker

## 2022-04-17 NOTE — Telephone Encounter (Signed)
H&V Care Navigation CSW Progress Note  Clinical Social Worker contacted patient by phone to f/u on request for resources as pt is uninsured and does not have a PCP. No answer at [phone]- voicemail full so I could not leave a message. I will re-attempt as able.   Patient is participating in a Managed Medicaid Plan:  No, self pay only.   SDOH Screenings   Food Insecurity: No Food Insecurity (04/16/2022)  Housing: High Risk (04/16/2022)  Transportation Needs: Unmet Transportation Needs (04/16/2022)  Utilities: At Risk (04/16/2022)  Alcohol Screen: Low Risk  (04/16/2022)  Financial Resource Strain: Medium Risk (04/16/2022)  Physical Activity: Insufficiently Active (04/16/2022)  Tobacco Use: High Risk (04/16/2022)   Westley Hummer, MSW, [place]  [phone]- work cell phone (preferred) [phone]- desk phone

## 2022-04-19 ENCOUNTER — Encounter (HOSPITAL_BASED_OUTPATIENT_CLINIC_OR_DEPARTMENT_OTHER): Payer: Self-pay | Admitting: Family

## 2022-04-20 ENCOUNTER — Telehealth: Payer: Self-pay

## 2022-04-20 ENCOUNTER — Telehealth: Payer: Self-pay | Admitting: *Deleted

## 2022-04-20 ENCOUNTER — Telehealth: Payer: Self-pay | Admitting: Licensed Clinical Social Worker

## 2022-04-20 DIAGNOSIS — Z0000 Encounter for general adult medical examination without abnormal findings: Secondary | ICD-10-CM

## 2022-04-20 NOTE — Telephone Encounter (Signed)
Contacted for second time this month regarding PREP Class referral. Unable to leave voice message as it is full.

## 2022-04-20 NOTE — Telephone Encounter (Signed)
Called patient for [employer] call and health coaching per his survey response that he is interested in quitting smoking. Patient's voicemail is full and was not able to leave a message. Sent patient a message via [place] regarding this matter to return my call.    Albertia Carvin Truman [facility]ward [facility] Guide, Health Coach [address], [address] [place] [address] Telephone: [phone] Email: [email or web address]

## 2022-04-20 NOTE — Telephone Encounter (Signed)
H&V Care Navigation CSW Progress Note  Clinical Social Worker contacted patient by phone to f/u on request for resources as pt is uninsured and does not have a PCP. No answer again this morning at [phone]- voicemail full still so I could not leave a message. I will re-attempt again this week.   Patient is participating in a Managed Medicaid Plan:  No, self pay only.     SDOH Screenings   Food Insecurity: No Food Insecurity (04/16/2022)  Housing: High Risk (04/16/2022)  Transportation Needs: Unmet Transportation Needs (04/16/2022)  Utilities: At Risk (04/16/2022)  Alcohol Screen: Low Risk  (04/16/2022)  Financial Resource Strain: Medium Risk (04/16/2022)  Physical Activity: Insufficiently Active (04/16/2022)  Tobacco Use: High Risk (04/19/2022)    Westley Hummer, MSW, LCSW Clinical Social Worker [place]  [phone]- work cell phone (preferred) [phone]- desk phone

## 2022-04-22 ENCOUNTER — Telehealth: Payer: Self-pay

## 2022-04-22 ENCOUNTER — Telehealth: Payer: Self-pay | Admitting: Licensed Clinical Social Worker

## 2022-04-22 DIAGNOSIS — Z0000 Encounter for general adult medical examination without abnormal findings: Secondary | ICD-10-CM

## 2022-04-22 NOTE — Telephone Encounter (Signed)
H&V Care Navigation CSW Progress Note  Clinical Social Worker contacted patient by phone to f/u on for resources as pt is uninsured and does not have a PCP. No answer again this morning at [phone]- voicemail full still so I could not leave a message. I have asked Amy, Health Coach, to send him both my information and the information for PREP program as we both have been unable to get in touch with him via [place]. I remain available.   Patient is participating in a Managed Medicaid Plan:  No, self pay only.   SDOH Screenings   Food Insecurity: No Food Insecurity (04/16/2022)  Housing: High Risk (04/16/2022)  Transportation Needs: Unmet Transportation Needs (04/16/2022)  Utilities: At Risk (04/16/2022)  Alcohol Screen: Low Risk  (04/16/2022)  Financial Resource Strain: Medium Risk (04/16/2022)  Physical Activity: Insufficiently Active (04/16/2022)  Tobacco Use: High Risk (04/19/2022)   Westley Hummer, MSW, LCSW Clinical Social Worker [place]  [phone]- work cell phone (preferred) [phone]- desk phone

## 2022-04-22 NOTE — Telephone Encounter (Signed)
Called patient to conduct Welcome Call and offer health coaching per survey response for smoking cessation. Patient requested that his morning prompt time be changed. Patient declined health coaching for smoking cessation. Patient explained that he is missing readings due to leaving his device in his girlfriend's vehicle and will regain access to it today. Patient's morning prompt time has been changed. Patient had no further concerns or questions.    Garry Bochicchio Truman Hayward [facility] Guide, Health Coach [address] [place] [address] Telephone: [phone] Email: [email or web address]

## 2022-04-27 ENCOUNTER — Telehealth: Payer: Self-pay | Admitting: Licensed Clinical Social Worker

## 2022-04-27 NOTE — Telephone Encounter (Signed)
H&V Care Navigation CSW Progress Note  Clinical Social Worker contacted patient by phone to f/u again on referral for resources. Was able to reach pt this morning at  [phone]. Introduced self, role, reason for call. Pt shares that this isnt a good time to speak and asks I call him back in a few hours. I share that I have attempted to call him three times and his voicemail is full therefore I am unable to leave messages. I requested the pt clear out some messages/give me a call back when his schedule permits. I remain available.   Patient is participating in a Managed Medicaid Plan:  No, self pay only.   SDOH Screenings   Food Insecurity: No Food Insecurity (04/16/2022)  Housing: High Risk (04/16/2022)  Transportation Needs: Unmet Transportation Needs (04/16/2022)  Utilities: At Risk (04/16/2022)  Alcohol Screen: Low Risk  (04/16/2022)  Financial Resource Strain: Medium Risk (04/16/2022)  Physical Activity: Insufficiently Active (04/16/2022)  Tobacco Use: High Risk (04/19/2022)   Westley Hummer, MSW, LCSW Clinical Social Worker [place]  [phone]- work cell phone (preferred) [phone]- desk phone

## 2022-04-28 ENCOUNTER — Telehealth: Payer: Self-pay | Admitting: Licensed Clinical Social Worker

## 2022-04-28 NOTE — Telephone Encounter (Signed)
H&V Care Navigation CSW Progress Note  Clinical Social Worker contacted patient by phone to f/u since no return call yesterday. Pt voicemail at [phone] is still full, unable to leave a message. Will mail application for [employer] . Have been unable to engage pt at this time for community resources despite multiple calls, and speaking directly with pt regarding assistance.   Patient is participating in a Managed Medicaid Plan:  No, self pay.   SDOH Screenings   Food Insecurity: No Food Insecurity (04/16/2022)  Housing: High Risk (04/16/2022)  Transportation Needs: Unmet Transportation Needs (04/16/2022)  Utilities: At Risk (04/16/2022)  Alcohol Screen: Low Risk  (04/16/2022)  Financial Resource Strain: Medium Risk (04/16/2022)  Physical Activity: Insufficiently Active (04/16/2022)  Tobacco Use: High Risk (04/19/2022)   Westley Hummer, MSW, LCSW Clinical Social Worker [place]  [phone]- work cell phone (preferred) [phone]- desk phone

## 2022-04-30 ENCOUNTER — Telehealth: Payer: Self-pay

## 2022-04-30 DIAGNOSIS — Z0000 Encounter for general adult medical examination without abnormal findings: Secondary | ICD-10-CM

## 2022-04-30 NOTE — Telephone Encounter (Signed)
Called patient because of missing readings in [place] to determine any challenges the patient may be having with his device. Unable to leave patient a message because voicemail is full. Sent patient a message via [employer] concerning this issue and my contact information to return call.    Dorla Guizar Truman Hayward [facility] Guide, Health Coach [address], [address] [place] [address] Telephone: [phone] Email: [email or web address]

## 2022-05-12 ENCOUNTER — Telehealth (HOSPITAL_BASED_OUTPATIENT_CLINIC_OR_DEPARTMENT_OTHER): Payer: Self-pay

## 2022-05-12 NOTE — Telephone Encounter (Addendum)
Attempted call to patient, no answer, left message for patient to call the office in regards to his blood pressure.   ----- Message from Avelino Leeds sent at 05/12/2022  1:24 PM EST ----- Regarding: [employer] - Noncompliance Hi team,  I wanted to let you know that I have tried to engage the patient with checking his bp twice daily. The social worker has tried to contact him. I was able to reach him once and he stated that he had to get it out of his girlfriends truck. He checked it 19 days ago and have not responded to any of my messages via [place]. We have been unable to leave messages due to his voicemail being full. Not sure what to do about this patient. Please advise.  Thanks, Amy

## 2022-05-14 ENCOUNTER — Encounter (HOSPITAL_BASED_OUTPATIENT_CLINIC_OR_DEPARTMENT_OTHER): Payer: Self-pay

## 2022-05-14 NOTE — Telephone Encounter (Signed)
2nd call attempt, no answer, left 2nd VM. Letter mailed to patient asking him to call us.

## 2022-05-21 ENCOUNTER — Telehealth: Payer: Self-pay

## 2022-05-21 DIAGNOSIS — Z0000 Encounter for general adult medical examination without abnormal findings: Secondary | ICD-10-CM

## 2022-05-21 NOTE — Telephone Encounter (Signed)
Called patient regarding [employer] noncompliance with checking his bp twice daily. Patient did not answer. Left message for patient to return call to discuss any issues with device or needing his prompt times changed.    Ronne Stefanski Nedra Hai [facility] Guide, Health Coach [address], [address] [place] [address] Telephone: [phone] Email: [email or web address]

## 2022-06-01 ENCOUNTER — Telehealth: Payer: Self-pay

## 2022-06-01 DIAGNOSIS — Z0000 Encounter for general adult medical examination without abnormal findings: Secondary | ICD-10-CM

## 2022-06-01 NOTE — Telephone Encounter (Signed)
Called patient due to noncompliance with bp checks in [employer]. Patient did not answer and mailbox is full. Was unable to leave a message at this time.    Devaughn Savant Nedra Hai [facility] Guide, Health Coach [address], [address] [place] [address] Telephone: [phone] Email: [email or web address]

## 2022-06-15 ENCOUNTER — Telehealth: Payer: Self-pay

## 2022-06-15 DIAGNOSIS — Z0000 Encounter for general adult medical examination without abnormal findings: Secondary | ICD-10-CM

## 2022-06-15 NOTE — Telephone Encounter (Signed)
Called patient regarding non-compliance with checking his blood pressure twice daily with [employer]. Patient did not answer. Left message for patient to return call to provide update if his prompt times works with his schedule or if he is having connectivity issues.    Justyne Roell Nedra Hai [facility] Guide, Health Coach [address] [place] [address] Telephone: [phone] Email: [email or web address]

## 2022-06-18 ENCOUNTER — Encounter (HOSPITAL_BASED_OUTPATIENT_CLINIC_OR_DEPARTMENT_OTHER): Payer: Self-pay

## 2022-06-18 ENCOUNTER — Ambulatory Visit (HOSPITAL_BASED_OUTPATIENT_CLINIC_OR_DEPARTMENT_OTHER): Payer: Self-pay | Admitting: Family
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
## 2022-08-10 ENCOUNTER — Ambulatory Visit (HOSPITAL_BASED_OUTPATIENT_CLINIC_OR_DEPARTMENT_OTHER): Payer: Self-pay | Admitting: Cardiovascular Disease

## 2022-08-10 NOTE — Progress Notes (Deleted)
Advanced Hypertension Clinic Initial Assessment:    Date:  08/10/2022   ID:  Randy Dunn, DOB 12/13/1985, MRN [id number]  PCP:  Patient, No Pcp Per  Cardiologist:  None  Nephrologist:  Referring MD: No ref. provider found   CC: Hypertension  History of Present Illness:    Randy Dunn is a 37 y.o. male with a hx of hypertension, tobacco use, and obesity here for follow up.  He was first seen in advanced hypertension clinic 04/2022.  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  He was seen in the ED 03/2022 and was started on amlodipine.  At the time of his initial visit he had been out of his medication for 3 days.  He was not feeling well on amlodipine and it was discontinued.  He was started on losartan and referred to the PREP program at the [facility].  He was encouraged to work on lifestyle modification including smoking cessation and increasing exercise.  He also enrolled in the remote patient monitoring study.  He has not been seen since that time and has not responded to multiple phone calls from our study coordinators, social workers, or care guide.   Previous antihypertensives: Amlodipine  Past Medical History:  Diagnosis Date   Hypertension     No past surgical history on file.  Current Medications: No outpatient medications have been marked as taking for the 08/10/22 encounter (Appointment) with Skeet Latch, MD.     Allergies:   Patient has no known allergies.   Social History   Socioeconomic History   Marital status: Single    Spouse name: Not on file   Number of children: Not on file   Years of education: Not on file   Highest education level: Not on file  Occupational History   Not on file  Tobacco Use   Smoking status: Every Day    Types: Cigarettes   Smokeless tobacco: Never  Substance and Sexual Activity   Alcohol use: Yes    Comment: 1x week   Drug use: Yes    Types: Marijuana   Sexual activity: Not on file  Other  Topics Concern   Not on file  Social History Narrative   Not on file   Social Determinants of Health   Financial Resource Strain: Medium Risk (04/16/2022)   Overall Financial Resource Strain (CARDIA)    Difficulty of Paying Living Expenses: Somewhat hard  Food Insecurity: No Food Insecurity (04/16/2022)   Hunger Vital Sign    Worried About Running Out of Food in the Last Year: Never true    Ran Out of Food in the Last Year: Never true  Transportation Needs: Unmet Transportation Needs (04/16/2022)   PRAPARE - Hydrologist (Medical): Yes    Lack of Transportation (Non-Medical): Yes  Physical Activity: Insufficiently Active (04/16/2022)   Exercise Vital Sign    Days of Exercise per Week: 4 days    Minutes of Exercise per Session: 20 min  Stress: Not on file  Social Connections: Not on file     Family History: The patient's ***family history includes Hypertension in his mother.  ROS:   Please see the history of present illness.    *** All other systems reviewed and are negative.  EKGs/Labs/Other Studies Reviewed:    EKG:  EKG is *** ordered today.  The ekg ordered today demonstrates ***  Recent Labs: 03/11/2022: ALT 15; BUN 14; Creatinine, Ser 0.94; Hemoglobin  15.1; Platelets 194; Potassium 3.5; Sodium 138   Recent Lipid Panel No results found for: "CHOL", "TRIG", "HDL", "CHOLHDL", "VLDL", "LDLCALC", "LDLDIRECT"  Physical Exam:   VS:  There were no vitals taken for this visit. , BMI There is no height or weight on file to calculate BMI. GENERAL:  Well appearing HEENT: Pupils equal round and reactive, fundi not visualized, oral mucosa unremarkable NECK:  No jugular venous distention, waveform within normal limits, carotid upstroke brisk and symmetric, no bruits, no thyromegaly LYMPHATICS:  No cervical adenopathy LUNGS:  Clear to auscultation bilaterally HEART:  RRR.  PMI not displaced or sustained,S1 and S2 within normal limits, no S3, no S4, no  clicks, no rubs, *** murmurs ABD:  Flat, positive bowel sounds normal in frequency in pitch, no bruits, no rebound, no guarding, no midline pulsatile mass, no hepatomegaly, no splenomegaly EXT:  2 plus pulses throughout, no edema, no cyanosis no clubbing SKIN:  No rashes no nodules NEURO:  Cranial nerves II through XII grossly intact, motor grossly intact throughout PSYCH:  Cognitively intact, oriented to person place and time   ASSESSMENT/PLAN:    No problem-specific Assessment & Plan notes found for this encounter.   Screening for Secondary Hypertension: { Click here to document screening for secondary causes of HTN  :[id number]    04/19/2022   12:47 PM  Causes  Sleep Apnea Not Screened     - Comments needs sleep study - cost prohibitive  Thyroid Disease Screened  Compliance Screened    Relevant Labs/Studies:    Latest Ref Rng & Units 03/11/2022   10:12 AM 04/11/2021    7:46 AM 04/07/2015    5:05 PM  Basic Labs  Sodium 135 - 145 mmol/L 138  144  134   Potassium 3.5 - 5.1 mmol/L 3.5  4.3  4.0   Creatinine 0.61 - 1.24 mg/dL 0.94  1.25  1.04       Disposition:    FU with MD/PharmD in {gen number [id number] {Days to years:10300}    Medication Adjustments/Labs and Tests Ordered: Current medicines are reviewed at length with the patient today.  Concerns regarding medicines are outlined above.  No orders of the defined types were placed in this encounter.  No orders of the defined types were placed in this encounter.    Signed, Skeet Latch, MD  08/10/2022 1:11 PM    [facility]

## 2023-02-16 ENCOUNTER — Emergency Department (HOSPITAL_COMMUNITY): Payer: No Typology Code available for payment source

## 2023-02-16 ENCOUNTER — Emergency Department (HOSPITAL_COMMUNITY)
Admission: EM | Admit: 2023-02-16 | Discharge: 2023-02-17 | Disposition: A | Discharge status: Home / Self Care | Payer: No Typology Code available for payment source | Attending: Emergency Medicine | Admitting: Emergency Medicine

## 2023-02-16 ENCOUNTER — Other Ambulatory Visit: Payer: Self-pay

## 2023-02-16 DIAGNOSIS — S61206A Unspecified open wound of right little finger without damage to nail, initial encounter: Secondary | ICD-10-CM | POA: Diagnosis not present

## 2023-02-16 DIAGNOSIS — S30811A Abrasion of abdominal wall, initial encounter: Secondary | ICD-10-CM | POA: Insufficient documentation

## 2023-02-16 DIAGNOSIS — S299XXA Unspecified injury of thorax, initial encounter: Secondary | ICD-10-CM | POA: Insufficient documentation

## 2023-02-16 DIAGNOSIS — M25531 Pain in right wrist: Secondary | ICD-10-CM | POA: Insufficient documentation

## 2023-02-16 DIAGNOSIS — S00211A Abrasion of right eyelid and periocular area, initial encounter: Secondary | ICD-10-CM | POA: Insufficient documentation

## 2023-02-16 DIAGNOSIS — R791 Abnormal coagulation profile: Secondary | ICD-10-CM | POA: Insufficient documentation

## 2023-02-16 DIAGNOSIS — Y92481 Parking lot as the place of occurrence of the external cause: Secondary | ICD-10-CM | POA: Insufficient documentation

## 2023-02-16 DIAGNOSIS — S3991XA Unspecified injury of abdomen, initial encounter: Secondary | ICD-10-CM | POA: Diagnosis not present

## 2023-02-16 DIAGNOSIS — M542 Cervicalgia: Secondary | ICD-10-CM | POA: Diagnosis not present

## 2023-02-16 DIAGNOSIS — S0990XA Unspecified injury of head, initial encounter: Secondary | ICD-10-CM | POA: Diagnosis not present

## 2023-02-16 DIAGNOSIS — M546 Pain in thoracic spine: Secondary | ICD-10-CM | POA: Diagnosis not present

## 2023-02-16 DIAGNOSIS — S61204A Unspecified open wound of right ring finger without damage to nail, initial encounter: Secondary | ICD-10-CM | POA: Diagnosis not present

## 2023-02-16 DIAGNOSIS — S6991XA Unspecified injury of right wrist, hand and finger(s), initial encounter: Secondary | ICD-10-CM | POA: Diagnosis present

## 2023-02-16 DIAGNOSIS — S61209A Unspecified open wound of unspecified finger without damage to nail, initial encounter: Secondary | ICD-10-CM

## 2023-02-16 LAB — I-STAT CHEM 8, ED
BUN: 13 mg/dL (ref 6–20)
Calcium, Ion: 1.07 mmol/L — ABNORMAL LOW (ref 1.15–1.40)
Chloride: 106 mmol/L (ref 98–111)
Creatinine, Ser: 0.9 mg/dL (ref 0.61–1.24)
Glucose, Bld: 120 mg/dL — ABNORMAL HIGH (ref 70–99)
HCT: 45 % (ref 39.0–52.0)
Hemoglobin: 15.3 g/dL (ref 13.0–17.0)
Potassium: 3.9 mmol/L (ref 3.5–5.1)
Sodium: 138 mmol/L (ref 135–145)
TCO2: 24 mmol/L (ref 22–32)

## 2023-02-16 LAB — CBC
HCT: 46.1 % (ref 39.0–52.0)
Hemoglobin: 15.2 g/dL (ref 13.0–17.0)
MCH: 30.6 pg (ref 26.0–34.0)
MCHC: 33 g/dL (ref 30.0–36.0)
MCV: 92.8 fL (ref 80.0–100.0)
Platelets: 220 10*3/uL (ref 150–400)
RBC: 4.97 MIL/uL (ref 4.22–5.81)
RDW: 13.1 % (ref 11.5–15.5)
WBC: 7.6 10*3/uL (ref 4.0–10.5)
nRBC: 0 % (ref 0.0–0.2)

## 2023-02-16 LAB — COMPREHENSIVE METABOLIC PANEL
ALT: 16 U/L (ref 0–44)
AST: 20 U/L (ref 15–41)
Albumin: 3.6 g/dL (ref 3.5–5.0)
Alkaline Phosphatase: 43 U/L (ref 38–126)
Anion gap: 12 (ref 5–15)
BUN: 12 mg/dL (ref 6–20)
CO2: 22 mmol/L (ref 22–32)
Calcium: 8.8 mg/dL — ABNORMAL LOW (ref 8.9–10.3)
Chloride: 104 mmol/L (ref 98–111)
Creatinine, Ser: 0.92 mg/dL (ref 0.61–1.24)
GFR, Estimated: >60 mL/min (ref >60)
Glucose, Bld: 119 mg/dL — ABNORMAL HIGH (ref 70–99)
Potassium: 3.9 mmol/L (ref 3.5–5.1)
Sodium: 138 mmol/L (ref 135–145)
Total Bilirubin: 0.5 mg/dL (ref 0.3–1.2)
Total Protein: 6.8 g/dL (ref 6.5–8.1)

## 2023-02-16 LAB — SAMPLE TO BLOOD BANK

## 2023-02-16 LAB — PROTIME-INR
INR: 0.9 (ref 0.8–1.2)
Prothrombin Time: 12.3 seconds (ref 11.4–15.2)

## 2023-02-16 LAB — URINALYSIS, ROUTINE W REFLEX MICROSCOPIC
Bilirubin Urine: NEGATIVE
Glucose, UA: NEGATIVE mg/dL
Hgb urine dipstick: NEGATIVE
Ketones, ur: NEGATIVE mg/dL
Leukocytes,Ua: NEGATIVE
Nitrite: NEGATIVE
Protein, ur: NEGATIVE mg/dL
Specific Gravity, Urine: 1.013 (ref 1.005–1.030)
pH: 7 (ref 5.0–8.0)

## 2023-02-16 LAB — ETHANOL: Alcohol, Ethyl (B): <10 mg/dL (ref <10)

## 2023-02-16 LAB — I-STAT CG4 LACTIC ACID, ED: Lactic Acid, Venous: 1.3 mmol/L (ref 0.5–1.9)

## 2023-02-16 MED ORDER — LIDOCAINE HCL (PF) 1 % IJ SOLN
INTRAMUSCULAR | Status: AC
  Filled 2023-02-16: qty 5

## 2023-02-16 MED ORDER — IOHEXOL 350 MG/ML SOLN
75.0000 mL | Freq: Once | INTRAVENOUS | Status: AC | PRN
  Administered 2023-02-16: 75 mL via INTRAVENOUS

## 2023-02-16 MED ORDER — NAPROXEN 375 MG PO TABS: 375.0000 mg | ORAL_TABLET | Freq: Two times a day (BID) | ORAL | 0 refills | Status: DC

## 2023-02-16 MED ORDER — TETANUS-DIPHTH-ACELL PERTUSSIS 5-2.5-18.5 LF-MCG/0.5 IM SUSY
0.5000 mL | PREFILLED_SYRINGE | Freq: Once | INTRAMUSCULAR | Status: DC
  Filled 2023-02-16: qty 0.5

## 2023-02-16 MED ORDER — FENTANYL CITRATE PF 50 MCG/ML IJ SOSY
50.0000 ug | PREFILLED_SYRINGE | Freq: Once | INTRAMUSCULAR | Status: DC
  Filled 2023-02-16: qty 1

## 2023-02-16 MED ORDER — LIDOCAINE-EPINEPHRINE (PF) 2 %-1:200000 IJ SOLN
10.0000 mL | Freq: Once | INTRAMUSCULAR | Status: AC
  Administered 2023-02-16: 10 mL via INTRADERMAL
  Filled 2023-02-16: qty 20

## 2023-02-16 MED ORDER — ACETAMINOPHEN 500 MG PO TABS
1000.0000 mg | ORAL_TABLET | Freq: Once | ORAL | Status: AC
  Administered 2023-02-16: 1000 mg via ORAL
  Filled 2023-02-16: qty 2

## 2023-02-16 MED ORDER — LIDOCAINE HCL (PF) 1 % IJ SOLN
5.0000 mL | Freq: Once | INTRAMUSCULAR | Status: AC
  Administered 2023-02-16: 5 mL via INTRADERMAL

## 2023-02-16 MED ORDER — LIDOCAINE HCL (PF) 1 % IJ SOLN
5.0000 mL | Freq: Once | INTRAMUSCULAR | Status: AC
  Administered 2023-02-16: 5 mL via INTRADERMAL
  Filled 2023-02-16: qty 5

## 2023-02-16 MED ORDER — ONDANSETRON HCL 4 MG/2ML IJ SOLN
4.0000 mg | Freq: Once | INTRAMUSCULAR | Status: DC
  Filled 2023-02-16: qty 2

## 2023-02-16 MED ORDER — METHOCARBAMOL 500 MG PO TABS: 500.0000 mg | ORAL_TABLET | Freq: Three times a day (TID) | ORAL | 0 refills | Status: DC | PRN
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
## 2023-02-16 NOTE — Discharge Instructions (Signed)
You will need to call Dr. Bari Edward office tomorrow morning.  Please set up an appointment to follow-up.\  Keep your splint on until you see Dr. Orlan Leavens. Get help right away if you have bleeding through your gauze or pain out of proportion.  Please expect that you will be very sore over the next few days.
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
## 2023-02-16 NOTE — ED Notes (Signed)
Spoke with patient and family advised only two visitors.  Advised they can switch out.  Family cursing at this nurse about no one seeing him yet.  Advised we are working very hard and a provider will be over shortly. One family said that wasn't good enough and we are only worried about visitors.  Spoke with PA who will  be going to see this patient.

## 2023-02-17 NOTE — ED Notes (Signed)
Pt ambulatory in hallway.  This RN asked pt to wait a moment for discharge paperwork.  Pt provided with AVS.  Education complete; all questions answered.  Pt leaving ED in stable condition at this time, ambulatory with all belongings.  Pt leaving prior to obtaining discharge vitals.

## 2023-02-17 NOTE — Progress Notes (Signed)
Orthopedic Tech Progress Note Patient Details:  Randy Dunn [birth year] [id number]  Ortho Devices Type of Ortho Device: Ulna gutter splint Ortho Device/Splint Location: RUE Ortho Device/Splint Interventions: Ordered, Application, Adjustment   Post Interventions Patient Tolerated: Well  Al Decant 02/17/2023, 12:09 AM

## 2023-02-23 ENCOUNTER — Other Ambulatory Visit: Payer: Self-pay

## 2023-02-23 ENCOUNTER — Encounter (HOSPITAL_COMMUNITY): Payer: Self-pay | Admitting: Orthopedic Surgery

## 2023-02-23 NOTE — Progress Notes (Signed)
SDW call  Patient was given pre-op instructions over the phone. Patient verbalized understanding of instructions provided.     PCP - Denies Cardiologist -  Pulmonary:    PPM/ICD - denies Device Orders - n/a Rep Notified - n/a   Chest x-ray - 02/16/2023 EKG -  03/11/2022 Stress Test - ECHO -  Cardiac Cath -   Sleep Study/sleep apnea/CPAP: denies  Non-diabetic   Blood Thinner Instructions: denies Aspirin Instructions:denies   ERAS Protcol - Yes, clear fluids until noon   COVID TEST- n/a    Anesthesia review: No   Patient denies shortness of breath, fever, cough and chest pain over the phone call  Your procedure is scheduled on Wednesday February 24, 2023  Report to [facility] Main Entrance "A" at  1230 PM, then check in with the Admitting office.  Call this number if you have problems the morning of surgery:  [phone]   If you have any questions prior to your surgery date call [phone]: Open Monday-Friday 8am-4pm If you experience any cold or flu symptoms such as cough, fever, chills, shortness of breath, etc. between now and your scheduled surgery, please notify us at the above number    Remember:  Do not eat after midnight the night before your surgery  You may drink clear liquids until  noon the day of your surgery.   Clear liquids allowed are: Water, Non-Citrus Juices (without pulp), Carbonated Beverages, Clear Tea, Black Coffee ONLY (NO MILK, CREAM OR POWDERED CREAMER of any kind), and Gatorade   Take these medicines as needed the morning of surgery with A SIP OF WATER:  Robaxin  As of today, STOP taking any Aspirin (unless otherwise instructed by your surgeon) Aleve, Naproxen, Ibuprofen, Motrin, Advil, Goody's, BC's, all herbal medications, fish oil, and all vitamins.

## 2023-02-24 ENCOUNTER — Ambulatory Visit (HOSPITAL_BASED_OUTPATIENT_CLINIC_OR_DEPARTMENT_OTHER): Payer: No Typology Code available for payment source | Admitting: Anesthesiology

## 2023-02-24 ENCOUNTER — Encounter (HOSPITAL_COMMUNITY)
Admission: RE | Disposition: A | Discharge status: Home / Self Care | Payer: Self-pay | Source: Home / Self Care | Attending: Orthopedic Surgery

## 2023-02-24 ENCOUNTER — Ambulatory Visit (HOSPITAL_COMMUNITY): Payer: No Typology Code available for payment source | Admitting: Anesthesiology

## 2023-02-24 ENCOUNTER — Other Ambulatory Visit: Payer: Self-pay

## 2023-02-24 ENCOUNTER — Ambulatory Visit (HOSPITAL_COMMUNITY)
Admission: RE | Admit: 2023-02-24 | Discharge: 2023-02-24 | Disposition: A | Discharge status: Home / Self Care | Payer: No Typology Code available for payment source | Attending: Orthopedic Surgery | Admitting: Orthopedic Surgery

## 2023-02-24 ENCOUNTER — Encounter (HOSPITAL_COMMUNITY): Payer: Self-pay | Admitting: Orthopedic Surgery

## 2023-02-24 DIAGNOSIS — S61401A Unspecified open wound of right hand, initial encounter: Secondary | ICD-10-CM

## 2023-02-24 DIAGNOSIS — F17210 Nicotine dependence, cigarettes, uncomplicated: Secondary | ICD-10-CM | POA: Insufficient documentation

## 2023-02-24 DIAGNOSIS — S61411A Laceration without foreign body of right hand, initial encounter: Secondary | ICD-10-CM | POA: Insufficient documentation

## 2023-02-24 HISTORY — DX: Depression, unspecified: F32.A

## 2023-02-24 HISTORY — PX: INCISION AND DRAINAGE OF WOUND: SHX1803

## 2023-02-24 HISTORY — DX: Anxiety disorder, unspecified: F41.9

## 2023-02-24 SURGERY — IRRIGATION AND DEBRIDEMENT WOUND
Anesthesia: General | Site: Hand | Laterality: Right

## 2023-02-24 MED ORDER — ACETAMINOPHEN 10 MG/ML IV SOLN
1000.0000 mg | Freq: Once | INTRAVENOUS | Status: DC | PRN
  Administered 2023-02-24: 1000 mg via INTRAVENOUS

## 2023-02-24 MED ORDER — GLYCOPYRROLATE PF 0.2 MG/ML IJ SOSY
PREFILLED_SYRINGE | INTRAMUSCULAR | Status: AC
  Filled 2023-02-24: qty 1

## 2023-02-24 MED ORDER — FENTANYL CITRATE (PF) 250 MCG/5ML IJ SOLN
INTRAMUSCULAR | Status: AC
  Filled 2023-02-24: qty 5

## 2023-02-24 MED ORDER — GLYCOPYRROLATE 0.2 MG/ML IJ SOLN
INTRAMUSCULAR | Status: DC | PRN
  Administered 2023-02-24: .2 mg via INTRAVENOUS

## 2023-02-24 MED ORDER — DEXAMETHASONE SODIUM PHOSPHATE 10 MG/ML IJ SOLN
INTRAMUSCULAR | Status: DC | PRN
  Administered 2023-02-24: 6 mg via INTRAVENOUS

## 2023-02-24 MED ORDER — ONDANSETRON HCL 4 MG/2ML IJ SOLN
INTRAMUSCULAR | Status: AC
  Filled 2023-02-24: qty 2

## 2023-02-24 MED ORDER — OXYCODONE HCL 5 MG PO TABS
5.0000 mg | ORAL_TABLET | Freq: Once | ORAL | Status: AC | PRN
  Administered 2023-02-24: 5 mg via ORAL

## 2023-02-24 MED ORDER — EPHEDRINE SULFATE-NACL 50-0.9 MG/10ML-% IV SOSY
PREFILLED_SYRINGE | INTRAVENOUS | Status: DC | PRN
  Administered 2023-02-24: 10 mg via INTRAVENOUS

## 2023-02-24 MED ORDER — ORAL CARE MOUTH RINSE: 15.0000 mL | Freq: Once | OROMUCOSAL | Status: AC

## 2023-02-24 MED ORDER — PROPOFOL 10 MG/ML IV BOLUS
INTRAVENOUS | Status: AC
  Filled 2023-02-24: qty 20

## 2023-02-24 MED ORDER — ONDANSETRON HCL 4 MG/2ML IJ SOLN
INTRAMUSCULAR | Status: DC | PRN
  Administered 2023-02-24: 4 mg via INTRAVENOUS

## 2023-02-24 MED ORDER — PHENYLEPHRINE 80 MCG/ML (10ML) SYRINGE FOR IV PUSH (FOR BLOOD PRESSURE SUPPORT)
PREFILLED_SYRINGE | INTRAVENOUS | Status: DC | PRN
  Administered 2023-02-24: 80 ug via INTRAVENOUS

## 2023-02-24 MED ORDER — FENTANYL CITRATE (PF) 250 MCG/5ML IJ SOLN
INTRAMUSCULAR | Status: DC | PRN
  Administered 2023-02-24: 100 ug via INTRAVENOUS
  Administered 2023-02-24: 50 ug via INTRAVENOUS
  Administered 2023-02-24: 100 ug via INTRAVENOUS

## 2023-02-24 MED ORDER — EPHEDRINE 5 MG/ML INJ
INTRAVENOUS | Status: AC
  Filled 2023-02-24: qty 5

## 2023-02-24 MED ORDER — PHENYLEPHRINE 80 MCG/ML (10ML) SYRINGE FOR IV PUSH (FOR BLOOD PRESSURE SUPPORT)
PREFILLED_SYRINGE | INTRAVENOUS | Status: AC
  Filled 2023-02-24: qty 10

## 2023-02-24 MED ORDER — OXYCODONE HCL 5 MG PO TABS
ORAL_TABLET | ORAL | Status: AC
  Filled 2023-02-24: qty 1

## 2023-02-24 MED ORDER — FENTANYL CITRATE (PF) 100 MCG/2ML IJ SOLN
INTRAMUSCULAR | Status: AC
  Filled 2023-02-24: qty 2

## 2023-02-24 MED ORDER — MIDAZOLAM HCL 2 MG/2ML IJ SOLN
INTRAMUSCULAR | Status: AC
  Filled 2023-02-24: qty 2

## 2023-02-24 MED ORDER — DEXMEDETOMIDINE HCL IN NACL 80 MCG/20ML IV SOLN
INTRAVENOUS | Status: DC | PRN
  Administered 2023-02-24 (×2): 10 ug via INTRAVENOUS

## 2023-02-24 MED ORDER — BUPIVACAINE HCL (PF) 0.5 % IJ SOLN
INTRAMUSCULAR | Status: AC
  Filled 2023-02-24: qty 30

## 2023-02-24 MED ORDER — CHLORHEXIDINE GLUCONATE 0.12 % MT SOLN
OROMUCOSAL | Status: AC
  Administered 2023-02-24: 15 mL via OROMUCOSAL
  Filled 2023-02-24: qty 15

## 2023-02-24 MED ORDER — CEFAZOLIN SODIUM-DEXTROSE 2-4 GM/100ML-% IV SOLN
INTRAVENOUS | Status: AC
  Filled 2023-02-24: qty 100

## 2023-02-24 MED ORDER — LIDOCAINE 2% (20 MG/ML) 5 ML SYRINGE
INTRAMUSCULAR | Status: AC
  Filled 2023-02-24: qty 5

## 2023-02-24 MED ORDER — MIDAZOLAM HCL 2 MG/2ML IJ SOLN
INTRAMUSCULAR | Status: DC | PRN
  Administered 2023-02-24: 2 mg via INTRAVENOUS

## 2023-02-24 MED ORDER — LACTATED RINGERS IV SOLN: INTRAVENOUS | Status: DC

## 2023-02-24 MED ORDER — ACETAMINOPHEN 500 MG PO TABS: 1000.0000 mg | ORAL_TABLET | Freq: Once | ORAL | Status: DC | PRN

## 2023-02-24 MED ORDER — DEXAMETHASONE SODIUM PHOSPHATE 10 MG/ML IJ SOLN
INTRAMUSCULAR | Status: AC
  Filled 2023-02-24: qty 1

## 2023-02-24 MED ORDER — OXYCODONE HCL 5 MG/5ML PO SOLN: 5.0000 mg | Freq: Once | ORAL | Status: AC | PRN

## 2023-02-24 MED ORDER — PROPOFOL 10 MG/ML IV BOLUS
INTRAVENOUS | Status: DC | PRN
  Administered 2023-02-24: 200 mg via INTRAVENOUS
  Administered 2023-02-24: 70 mg via INTRAVENOUS

## 2023-02-24 MED ORDER — ACETAMINOPHEN 160 MG/5ML PO SOLN: 1000.0000 mg | Freq: Once | ORAL | Status: DC | PRN

## 2023-02-24 MED ORDER — DEXMEDETOMIDINE HCL IN NACL 80 MCG/20ML IV SOLN
INTRAVENOUS | Status: AC
  Filled 2023-02-24: qty 20

## 2023-02-24 MED ORDER — CHLORHEXIDINE GLUCONATE 0.12 % MT SOLN: 15.0000 mL | Freq: Once | OROMUCOSAL | Status: AC

## 2023-02-24 MED ORDER — BUPIVACAINE-EPINEPHRINE (PF) 0.25% -1:200000 IJ SOLN
INTRAMUSCULAR | Status: AC
  Filled 2023-02-24: qty 30

## 2023-02-24 MED ORDER — CEFAZOLIN IN SODIUM CHLORIDE 3-0.9 GM/100ML-% IV SOLN
3.0000 g | INTRAVENOUS | Status: AC
  Administered 2023-02-24: 3 g via INTRAVENOUS

## 2023-02-24 MED ORDER — ACETAMINOPHEN 10 MG/ML IV SOLN
INTRAVENOUS | Status: AC
  Filled 2023-02-24: qty 100

## 2023-02-24 MED ORDER — FENTANYL CITRATE (PF) 100 MCG/2ML IJ SOLN
25.0000 ug | INTRAMUSCULAR | Status: DC | PRN
  Administered 2023-02-24 (×2): 50 ug via INTRAVENOUS

## 2023-02-24 MED ORDER — LIDOCAINE 2% (20 MG/ML) 5 ML SYRINGE
INTRAMUSCULAR | Status: DC | PRN
  Administered 2023-02-24: 60 mg via INTRAVENOUS

## 2023-02-24 SURGICAL SUPPLY — 40 items
ANCH SUT 2-0 3/8 CRC MIC FT 4 (Anchor) ×1 IMPLANT
ANCHOR FT CORKSCREW MICRO 2-0 (Anchor) IMPLANT
BAG COUNTER SPONGE SURGICOUNT (BAG) ×2 IMPLANT
BAG SPNG CNTER NS LX DISP (BAG)
BNDG ADH 5X4 AIR PERM ELC (GAUZE/BANDAGES/DRESSINGS) ×2
BNDG CMPR 9X4 STRL LF SNTH (GAUZE/BANDAGES/DRESSINGS) ×1
BNDG COHESIVE 4X5 WHT NS (GAUZE/BANDAGES/DRESSINGS) IMPLANT
BNDG ELASTIC 4X5.8 VLCR STR LF (GAUZE/BANDAGES/DRESSINGS) ×2 IMPLANT
BNDG ESMARK 4X9 LF (GAUZE/BANDAGES/DRESSINGS) IMPLANT
BNDG GAUZE DERMACEA FLUFF 4 (GAUZE/BANDAGES/DRESSINGS) ×2 IMPLANT
BNDG GZE DERMACEA 4 6PLY (GAUZE/BANDAGES/DRESSINGS) ×1
CORD BIPOLAR FORCEPS 12FT (ELECTRODE) IMPLANT
CUFF TOURN SGL QUICK 18X4 (TOURNIQUET CUFF) ×2 IMPLANT
DRAPE SURG 17X11 SM STRL (DRAPES) ×4 IMPLANT
DRSG EMULSION OIL 3X3 NADH (GAUZE/BANDAGES/DRESSINGS) ×2 IMPLANT
ELECT REM PT RETURN 9FT ADLT (ELECTROSURGICAL) ×1
ELECTRODE REM PT RTRN 9FT ADLT (ELECTROSURGICAL) ×2 IMPLANT
GAUZE PAD ABD 8X10 STRL (GAUZE/BANDAGES/DRESSINGS) ×2 IMPLANT
GAUZE SPONGE 4X4 12PLY STRL (GAUZE/BANDAGES/DRESSINGS) ×2 IMPLANT
GLOVE SURG ORTHO 8.0 STRL STRW (GLOVE) ×2 IMPLANT
GOWN STRL REUS W/ TWL XL LVL3 (GOWN DISPOSABLE) ×2 IMPLANT
GOWN STRL REUS W/TWL XL LVL3 (GOWN DISPOSABLE) ×1
IV NS IRRIG 3000ML ARTHROMATIC (IV SOLUTION) ×2 IMPLANT
KIT BASIN OR (CUSTOM PROCEDURE TRAY) ×2 IMPLANT
MANIFOLD NEPTUNE II (INSTRUMENTS) ×2 IMPLANT
PACK ORTHO EXTREMITY (CUSTOM PROCEDURE TRAY) ×2 IMPLANT
PAD CAST 4YDX4 CTTN HI CHSV (CAST SUPPLIES) ×2 IMPLANT
PADDING CAST COTTON 4X4 STRL (CAST SUPPLIES) ×2
SET CYSTO W/LG BORE CLAMP LF (SET/KITS/TRAYS/PACK) ×2 IMPLANT
SOAP 2 % CHG 4 OZ (WOUND CARE) ×2 IMPLANT
SPLINT FIBERGLASS 3X12 (CAST SUPPLIES) IMPLANT
SUT PROLENE 3 0 PS 2 (SUTURE) IMPLANT
SUT VIC AB 1 CT1 27 (SUTURE)
SUT VIC AB 1 CT1 27XBRD ANTBC (SUTURE) IMPLANT
SUT VIC AB 2-0 CT1 27 (SUTURE)
SUT VIC AB 2-0 CT1 27XBRD (SUTURE) IMPLANT
SUT VIC AB 2-0 CT2 27 (SUTURE) IMPLANT
SYR 20ML LL LF (SYRINGE) ×2 IMPLANT
SYR CONTROL 10ML LL (SYRINGE) ×2 IMPLANT
TOWEL GREEN STERILE (TOWEL DISPOSABLE) ×2 IMPLANT
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
## 2023-02-24 NOTE — Op Note (Signed)
PREOPERATIVE DIAGNOSIS: Right hand laceration with possible tendon involvement  POSTOPERATIVE DIAGNOSIS: Same  ATTENDING SURGEON: Dr. Bradly Bienenstock Scrubbed and present for the entire procedure  ASSISTANT SURGEON: None  ANESTHESIA: General Via LMA  OPERATIVE PROCEDURE: Right hand laceration exploration and flexor sheath drainage Right small finger metacarpal phalangeal joint radial collateral ligament repair Right small finger volar plate repair Right small finger radial digital nerve neurolysis and decompression. Right hand laceration repaired, 7 cm. Right hand interosseous muscle repair.  Small hand intrinsic muscle repair.   IMPLANTS: Arthrex mini corkscrew anchor  EBL: Minimal  RADIOGRAPHIC INTERPRETATION: None  SURGICAL INDICATIONS: Patient is a right-hand-dominant gentleman who sustained the injury to his right small finger webspace and near amputation of the small finger.  Patient was seen evaluate in the office and recommend undergo the above procedure.  The risks of surgery include but not limited to bleeding infection damage nearby nerves arteries or tendons loss of motion of the wrist and digits incomplete relief of symptoms and need for further surgical invention.  SURGICAL TECHNIQUE: The patient was prepped identified in the preoperative holding area marked with a prominent marker made on the right small finger to indicate correct operative site.  Patient then brought back the operating room placed supine on the anesthesia room table where the general anesthetic was administered.  Preoperative antibiotics were given prior to any skin incision.  A well-padded tourniquet was then placed on the right brachium and set with the appropriate drape.  The right upper extremities then prepped and draped in normal sterile fashion.  A timeout was called the correct site identified procedure then began.  Attention was then turned to the right hand.  A 7 cm laceration was extended  proximally and distally.  The laceration extended through the webspace.  Dissection carried down through the skin and subtendinous tissue after the tourniquet insufflated.  The flexor sheath was explored.  The patient did have continuity of the FDS and FDP.  The patient had avulsion of the radial artery to the small finger.  This was avulsed just distal to the takeoff from the common digital artery to the ring and small finger.  The patient's radial digital nerve was then carefully explored and this was in continuity.  Careful nerve decompression and neurolysis was then done through the traumatic zone freeing the nerve.  Following this deep dissection carried all the way down where the patient did have the injury to the volar plate.  The volar plate was then opened up from a volar position.  The radial collateral ligament had been completely torn.  Suture anchor was then placed within the metacarpal head recess.  Following this the limbs of the suture were delivered through the collateral ligament complex.  The volar plate was then repaired as well.  Joint had been thoroughly irrigated.  Following this with good closure over the volar plate as well as open repair of the palmar interosseous musculature the wound was then thoroughly irrigated.  Tourniquet deflated.  There is good hemostasis and good perfusion of the fingertip.  Skin was then closed using use simple 3-0 Prolene sutures.  Adaptic dressing sterile compressive bandage then applied.  The patient placed in well-padded ulnar gutter splint.  Patient taken recovery room in good condition extubated  POSTOPERATIVE PLAN: Patient discharged home.  See him back the office in approximately 2 weeks for wound check suture removal x-rays application of ulnar gutter cast.  Plan to see him back at the 5 to 6-week mark  for repeat radiographs and then begin an outpatient therapy regimen for collateral ligament repair protocol.

## 2023-02-24 NOTE — Anesthesia Preprocedure Evaluation (Signed)
Anesthesia Evaluation  Patient identified by MRN, date of birth, ID band Patient awake    Reviewed: Allergy & Precautions, NPO status , Patient's Chart, lab work & pertinent test results  History of Anesthesia Complications Negative for: history of anesthetic complications  Airway Mallampati: III  TM Distance: >3 FB Neck ROM: Full    Dental  (+) Teeth Intact, Dental Advisory Given,    Pulmonary neg shortness of breath, neg sleep apnea, neg recent URI, Current Smoker   breath sounds clear to auscultation       Cardiovascular hypertension, (-) angina (-) Past MI and (-) CHF  Rhythm:Regular     Neuro/Psych  PSYCHIATRIC DISORDERS Anxiety Depression    negative neurological ROS     GI/Hepatic negative GI ROS, Neg liver ROS,,,  Endo/Other  negative endocrine ROS    Renal/GU negative Renal ROS     Musculoskeletal  Right hand laceration with tendon and nerve involvement   Abdominal   Peds  Hematology negative hematology ROS (+)   Anesthesia Other Findings   Reproductive/Obstetrics                             Anesthesia Physical Anesthesia Plan  ASA: 2  Anesthesia Plan: General   Post-op Pain Management: Ofirmev IV (intra-op)* and Toradol IV (intra-op)*   Induction: Intravenous  PONV Risk Score and Plan: 1 and Ondansetron and Dexamethasone  Airway Management Planned: LMA and Oral ETT  Additional Equipment: None  Intra-op Plan:   Post-operative Plan: Extubation in OR  Informed Consent: I have reviewed the patients History and Physical, chart, labs and discussed the procedure including the risks, benefits and alternatives for the proposed anesthesia with the patient or authorized representative who has indicated his/her understanding and acceptance.     Dental advisory given  Plan Discussed with: CRNA  Anesthesia Plan Comments:        Anesthesia Quick Evaluation

## 2023-02-24 NOTE — Transfer of Care (Signed)
Immediate Anesthesia Transfer of Care Note  Patient: Randy Dunn  Procedure(s) Performed: Right hand laceration exploration and flexor sheath drainage (Right: Hand)  Patient Location: PACU  Anesthesia Type:General  Level of Consciousness: awake, alert , and oriented  Airway & Oxygen Therapy: Patient Spontanous Breathing and Patient connected to face mask  Post-op Assessment: Report given to RN and Post -op Vital signs reviewed and stable  Post vital signs: Reviewed and stable  Last Vitals:  Vitals Value Taken Time  BP 183/116 02/24/23 1731  Temp 36.3 C 02/24/23 1720  Pulse 68 02/24/23 1733  Resp 12 02/24/23 1733  SpO2 96 % 02/24/23 1733  Vitals shown include unfiled device data.  Last Pain:  Vitals:   02/24/23 1720  TempSrc:   PainSc: 10-Worst pain ever      Patients Stated Pain Goal: 3 (02/24/23 1720)  Complications: No notable events documented.

## 2023-02-24 NOTE — Discharge Instructions (Signed)
KEEP BANDAGE CLEAN AND DRY CALL OFFICE FOR F/U APPT [phone] KEEP HAND ELEVATED ABOVE HEART OK TO APPLY ICE TO OPERATIVE AREA CONTACT OFFICE IF ANY WORSENING PAIN OR CONCERNS.  RX SENT TO [facility], OXYCODONE

## 2023-02-25 ENCOUNTER — Encounter (HOSPITAL_COMMUNITY): Payer: Self-pay | Admitting: Orthopedic Surgery

## 2023-02-25 NOTE — Anesthesia Postprocedure Evaluation (Signed)
Anesthesia Post Note  Patient: Randy Dunn  Procedure(s) Performed: Right hand laceration exploration and flexor sheath drainage (Right: Hand)     Patient location during evaluation: PACU Anesthesia Type: General Level of consciousness: awake and alert Pain management: pain level controlled Vital Signs Assessment: post-procedure vital signs reviewed and stable Respiratory status: spontaneous breathing, nonlabored ventilation, respiratory function stable and patient connected to nasal cannula oxygen Cardiovascular status: blood pressure returned to baseline and stable Postop Assessment: no apparent nausea or vomiting Anesthetic complications: no   No notable events documented.  Last Vitals:  Vitals:   02/24/23 1815 02/24/23 1830  BP: (!) 157/122 (!) 167/109  Pulse: 66 60  Resp: 14 15  Temp:  (!) 36.3 C  SpO2: 100% 99%    Last Pain:  Vitals:   02/24/23 1830  TempSrc:   PainSc: 7                  Mariann Barter

## 2023-02-27 ENCOUNTER — Encounter (HOSPITAL_BASED_OUTPATIENT_CLINIC_OR_DEPARTMENT_OTHER): Payer: Self-pay | Admitting: Emergency Medicine

## 2023-02-27 ENCOUNTER — Other Ambulatory Visit: Payer: Self-pay

## 2023-02-27 ENCOUNTER — Emergency Department (HOSPITAL_BASED_OUTPATIENT_CLINIC_OR_DEPARTMENT_OTHER)
Admission: EM | Admit: 2023-02-27 | Discharge: 2023-02-27 | Disposition: A | Discharge status: Home / Self Care | Payer: Self-pay | Attending: Emergency Medicine | Admitting: Emergency Medicine

## 2023-02-27 DIAGNOSIS — M79641 Pain in right hand: Secondary | ICD-10-CM | POA: Insufficient documentation

## 2023-02-27 DIAGNOSIS — G8918 Other acute postprocedural pain: Secondary | ICD-10-CM | POA: Insufficient documentation

## 2023-02-27 DIAGNOSIS — M79601 Pain in right arm: Secondary | ICD-10-CM | POA: Insufficient documentation

## 2023-02-27 NOTE — Discharge Instructions (Signed)
You can double your dose of oxycodone to 2 tablets every 6 hours if needed. Add ibuprofen 600 mg (3 over-the-counter strength tablets) every 6 hours. Keep the hand elevated to reduce pain as well.   Call Dr. Glenna Durand office on Monday to schedule a time to be seen for recheck and splint care. Return to the ED with any new or worsening symptoms.

## 2023-02-27 NOTE — ED Notes (Signed)
 RN reviewed discharge instructions with pt. Pt verbalized understanding and had no further questions. VSS upon discharge.  
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
## 2023-02-27 NOTE — ED Triage Notes (Signed)
Pt drove self today

## 2023-02-27 NOTE — ED Notes (Signed)
ED Provider at bedside. 
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
## 2023-03-31 ENCOUNTER — Encounter: Payer: Self-pay | Admitting: Occupational Therapy

## 2023-03-31 ENCOUNTER — Ambulatory Visit: Payer: Self-pay | Admitting: Occupational Therapy

## 2023-03-31 ENCOUNTER — Other Ambulatory Visit: Payer: Self-pay

## 2023-03-31 ENCOUNTER — Ambulatory Visit: Payer: Self-pay | Attending: Orthopedic Surgery | Admitting: Occupational Therapy

## 2023-03-31 DIAGNOSIS — R208 Other disturbances of skin sensation: Secondary | ICD-10-CM | POA: Insufficient documentation

## 2023-03-31 DIAGNOSIS — M25641 Stiffness of right hand, not elsewhere classified: Secondary | ICD-10-CM | POA: Insufficient documentation

## 2023-03-31 DIAGNOSIS — M6281 Muscle weakness (generalized): Secondary | ICD-10-CM | POA: Insufficient documentation

## 2023-03-31 DIAGNOSIS — R601 Generalized edema: Secondary | ICD-10-CM | POA: Insufficient documentation

## 2023-03-31 DIAGNOSIS — M25541 Pain in joints of right hand: Secondary | ICD-10-CM | POA: Insufficient documentation

## 2023-03-31 DIAGNOSIS — R278 Other lack of coordination: Secondary | ICD-10-CM | POA: Insufficient documentation

## 2023-03-31 NOTE — Patient Instructions (Signed)
Protective splint wearing schedule:  Wear splint at ALL times except for hygiene care (May remove splint for exercises and then immediately place back on ONLY if directed by the therapist). Do not use your hand to pick things up, grip, lift or carry at this time.  PURPOSE of splint:  To prevent movement and for protection until your ligament and volar plate injuries to your small finger can heal  CARE OF SPLINT:  Keep splint away from heat sources including: stove, radiator or furnace, or a car in sunlight. The splint can melt and will no longer fit you properly  Keep away from pets and children  Clean the splint with rubbing alcohol as needed.  * During this time, make sure you also clean your hand/arm as instructed by your therapist and/or perform dressing changes as needed. Then dry hand/arm completely before replacing splint. (When cleaning hand/arm, keep it immobilized in same position until splint is replaced)  PRECAUTIONS/POTENTIAL PROBLEMS: *If you notice or experience increased pain, swelling, numbness, or a lingering reddened area from the splint: Contact your therapist immediately by calling [phone]. You must wear the splint for protection, but we will get you scheduled for adjustments as quickly as possible.  (If only straps or hooks need to be replaced and NO adjustments to the splint need to be made, just call the office ahead and let them know you are coming in)  If you have any medical concerns or signs of infection, please call your doctor immediately  Access Code: [email or web address] URL: [email or web address] Date: 03/31/2023 Prepared by: Mariam Dollar  Remove your splint for exercises and replace immediately afterwards. Do not move your small finger from side to side at any time, as this will put stress on your repaired radial collateral ligament at your MCP joint and your volar plate repair. Keep your fingers from moving from side to side (no ulnar or radial  deviation) during exercises.  Exercises - Seated Digit Tendon Gliding  - 4-6 x daily - 7 x weekly - 1 sets - 10 reps - 3-5 hold - Tendon Gliding Fist Series: Neutral Hand Position Progression  - 4-6 x daily - 7 x weekly - 1 sets - 10 reps - 3-5 hold - Hand Active Tabletop Fist Series  - 4-6 x daily - 7 x weekly - 1 sets - 10 reps - 3-5 hold Do not force finger bending of straightening. Go as far as you can using the muscles of your injured hand only, then hold for a count of 3-5 seconds for a stretch. Replace your splint when done  You may massage the scar on your hand to assist with desensitizing and increasing flexibility, 2-3x/day for .

## 2023-04-07 ENCOUNTER — Ambulatory Visit: Payer: Self-pay | Attending: Orthopedic Surgery | Admitting: Occupational Therapy

## 2023-04-14 ENCOUNTER — Ambulatory Visit: Payer: Self-pay | Admitting: Occupational Therapy

## 2023-04-15 IMAGING — CT CT NECK W/ CM
3 of 4 series · 13 of 33 positions shown, 16 images · IV contrast (omnipaque)
Comparison: None.

CLINICAL DATA: Peritonsillar abscess. Patient seen in [HOSPITAL]
and sent to emergency room for drainage of abscess. Strep throat

EXAM:
CT NECK WITH CONTRAST
TECHNIQUE: Multidetector CT imaging of the neck was performed using the
standard protocol following the bolus administration of intravenous
contrast.
CONTRAST:  80mL OMNIPAQUE IOHEXOL 350 MG/ML SOLN

[Series 2: axial neck · axial · 0.54mm/px · z∈[-223,-73]mm · 5 of 113 slices shown, 7 images]
[im 19/113  soft-tissue]
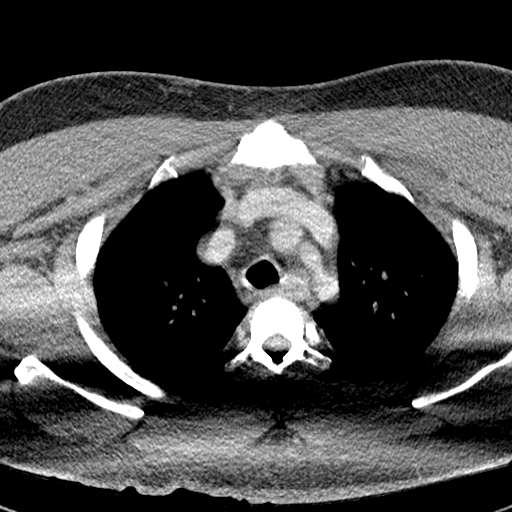
[im 19/113  bone]
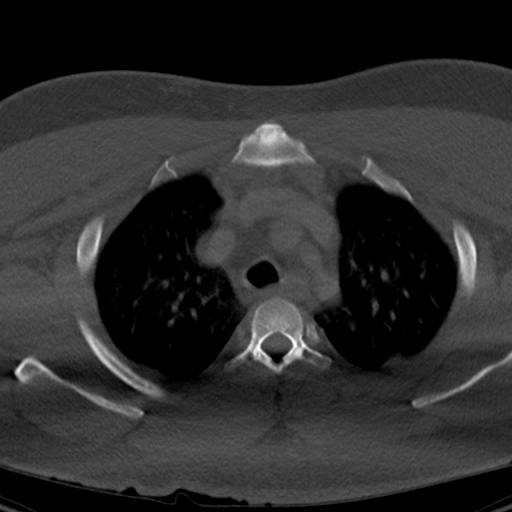
[im 38/113  bone]
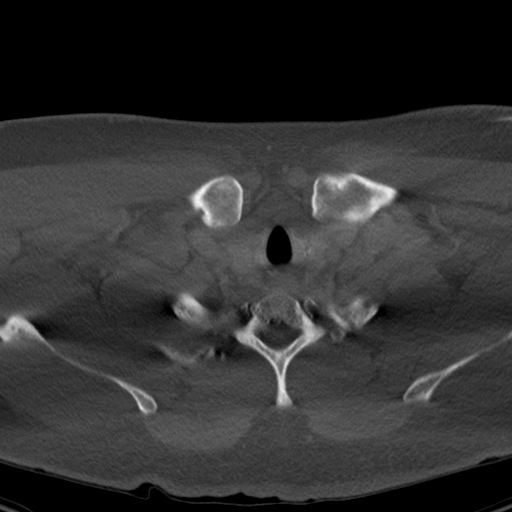
[im 57/113  bone]
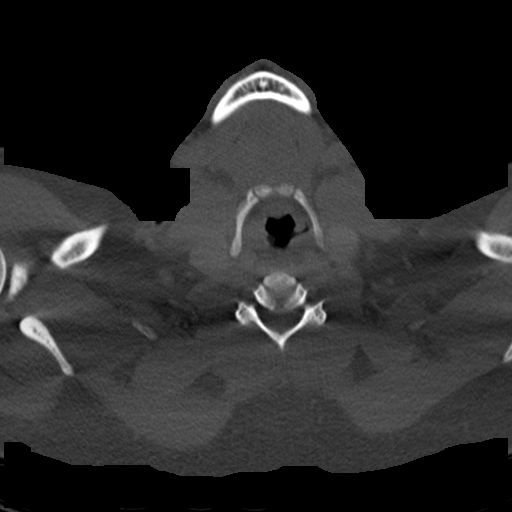
[im 75/113  bone]
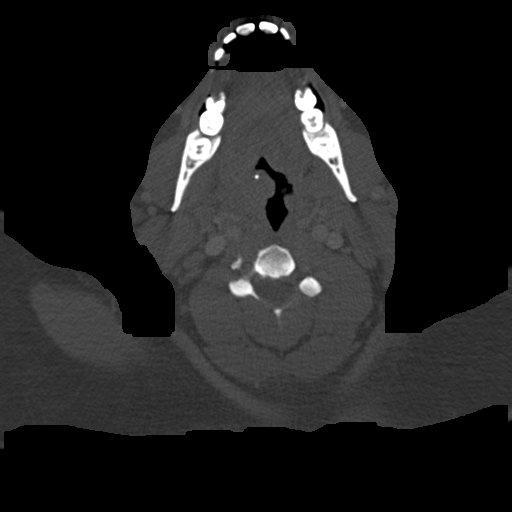
[im 94/113  soft-tissue]
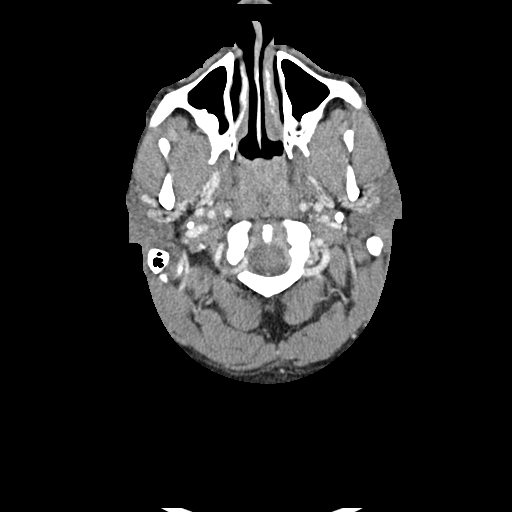
[im 94/113  bone]
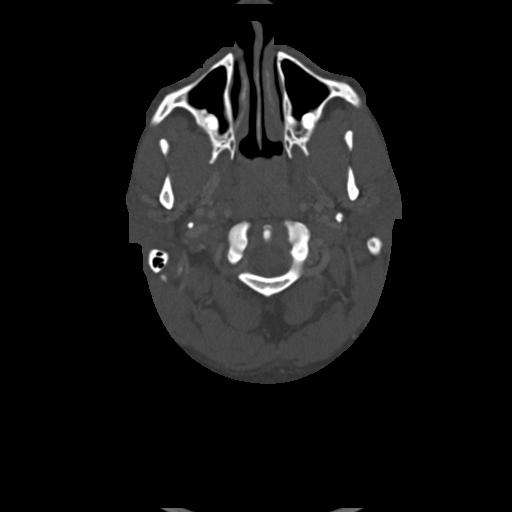

[Series 6: coronal · coronal · 0.44mm/px · 3 of 136 slices shown]
[im 28/136  bone]
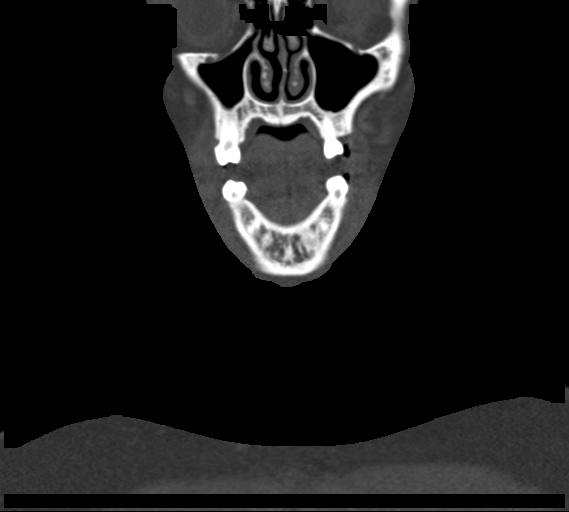
[im 55/136  bone]
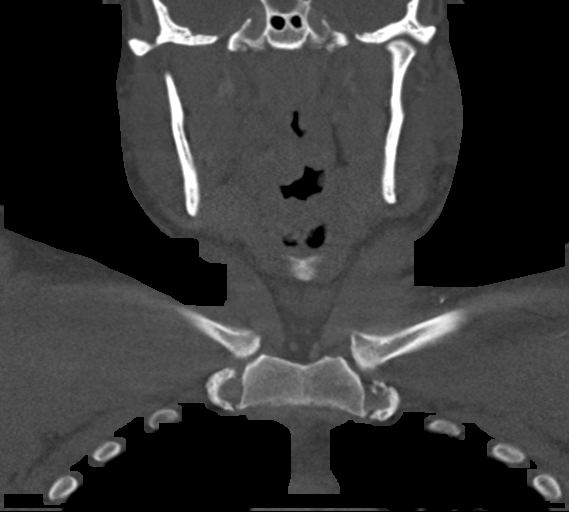
[im 82/136  bone]
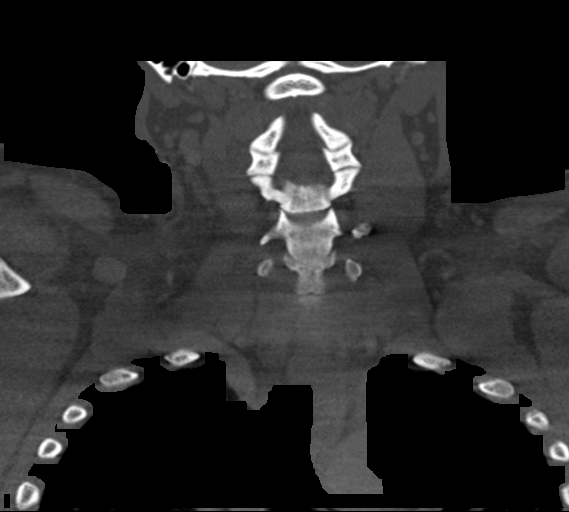

[Series 7: sagittal · sagittal · 0.44mm/px · 5 of 114 slices shown, 6 images]
[im 38/114  bone]
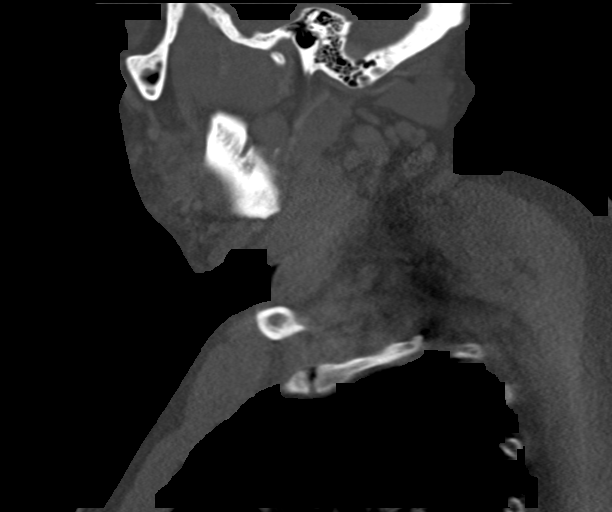
[im 48/114  bone]
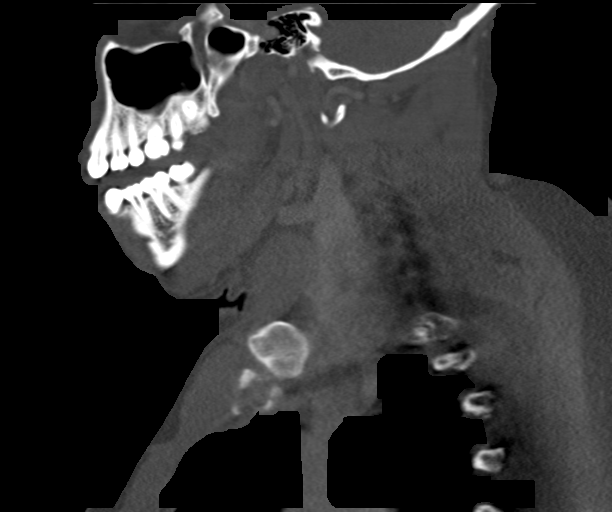
[im 57/114  soft-tissue]
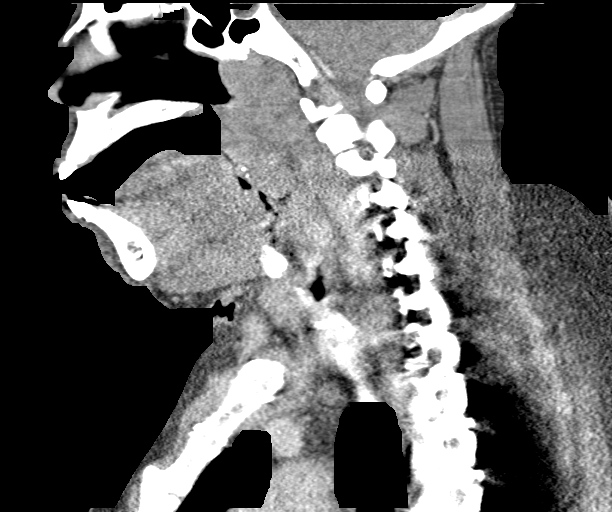
[im 57/114  bone]
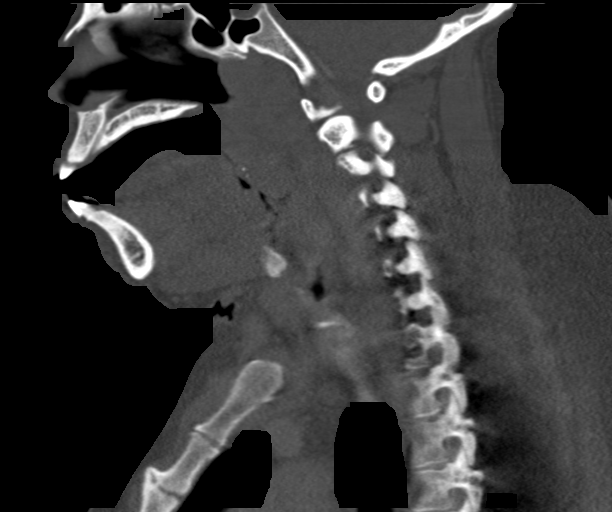
[im 66/114  bone]
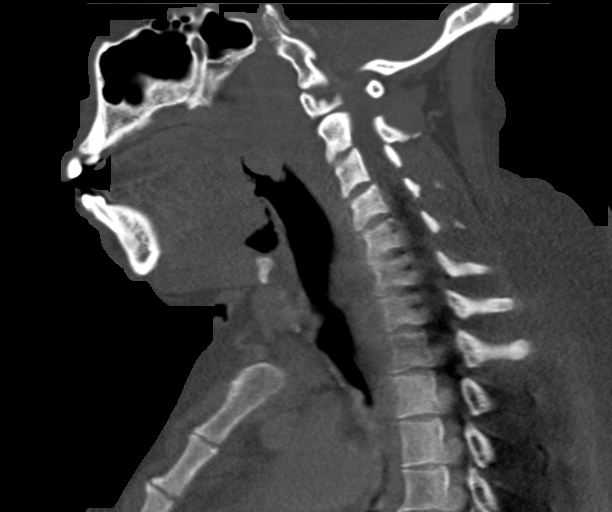
[im 76/114  bone]
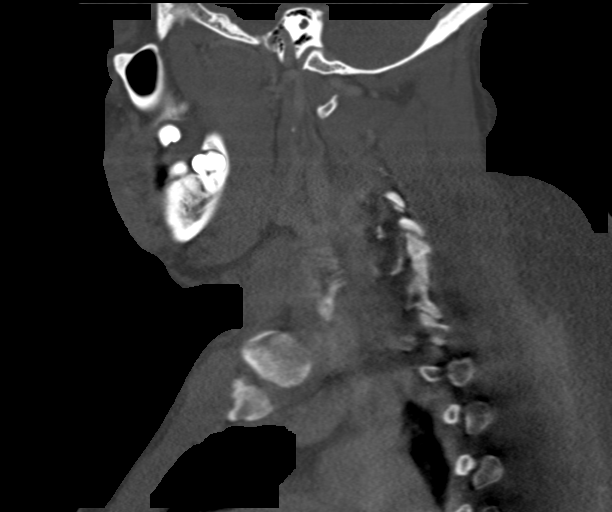

[13 of 33 positions shown; findings below may reference images not displayed]

FINDINGS: Pharynx and larynx: Adenoid tissue is hypertrophied without discrete
lesion. An ill-defined hypoattenuating area in or adjacent to the
right palatine tonsil measures 7 x 11 x 10 mm. Instinct an
incomplete peripheral enhancement is associated. The palatine
tonsils are enlarged bilaterally, right greater than left. The
airway is narrowed, but patent. Stranding is present in the right
parapharyngeal fat.

Lingual tonsils are diffusely enlarged. Vallecular is within normal
limits. Aryepiglottic folds and piriform sinuses are clear. Vocal
cords are midline and symmetric. Trachea is clear.

Salivary glands: The submandibular and parotid glands and ducts are
within normal limits.

Thyroid: Normal

Lymph nodes: Enlarged level 2 lymph nodes are present bilaterally,
right greater than left. These are likely reactive.

Vascular: Normal

Limited intracranial: Within normal limits.

Visualized orbits: Remote right orbital blowout fracture is present.
Globes and orbits are otherwise within normal limits.

Mastoids and visualized paranasal sinuses: The paranasal sinuses and
mastoid air cells are clear.

Skeleton: Vertebral body heights and alignment are normal. No focal
lytic or blastic lesions are present.

Upper chest: The lung apices are clear. Thoracic inlet is within
normal limits.
IMPRESSION: 1. Enlarged palatine tonsils and lingual tonsils compatible with
acute pharyngitis.
2. 7 x 11 x 10 mm hypoattenuating area in or adjacent to the right
palatine tonsil is concerning for a developing abscess. No definite
drainable abscess is yet present.
3. Narrowing of the airway is narrowed, but patent.
4. Enlarged level 2 lymph nodes bilaterally, right greater than
left, are likely reactive.
5. Remote right orbital blowout fracture.

## 2023-04-21 ENCOUNTER — Ambulatory Visit: Payer: Self-pay | Admitting: Occupational Therapy

## 2023-04-21 ENCOUNTER — Encounter: Payer: Self-pay | Admitting: Occupational Therapy

## 2023-04-21 NOTE — Therapy (Signed)
Pt d/c due to attendance policy as he has been a no show for 3 visits. He will require a new referral should further therapy services be needed. Left message at primary contact number and notified pt of d/c.

## 2023-04-28 ENCOUNTER — Encounter: Payer: Self-pay | Admitting: Occupational Therapy

## 2023-05-05 ENCOUNTER — Encounter: Payer: Self-pay | Admitting: Occupational Therapy

## 2023-05-12 ENCOUNTER — Encounter: Payer: Self-pay | Admitting: Occupational Therapy

## 2023-07-15 ENCOUNTER — Encounter (HOSPITAL_BASED_OUTPATIENT_CLINIC_OR_DEPARTMENT_OTHER): Payer: Self-pay | Admitting: Emergency Medicine

## 2023-07-15 ENCOUNTER — Other Ambulatory Visit (HOSPITAL_BASED_OUTPATIENT_CLINIC_OR_DEPARTMENT_OTHER): Payer: Self-pay

## 2023-07-15 ENCOUNTER — Other Ambulatory Visit: Payer: Self-pay

## 2023-07-15 ENCOUNTER — Emergency Department (HOSPITAL_BASED_OUTPATIENT_CLINIC_OR_DEPARTMENT_OTHER)
Admission: EM | Admit: 2023-07-15 | Discharge: 2023-07-15 | Disposition: A | Discharge status: Home / Self Care | Payer: Self-pay | Attending: Emergency Medicine | Admitting: Emergency Medicine

## 2023-07-15 DIAGNOSIS — R519 Headache, unspecified: Secondary | ICD-10-CM | POA: Insufficient documentation

## 2023-07-15 DIAGNOSIS — K0889 Other specified disorders of teeth and supporting structures: Secondary | ICD-10-CM | POA: Insufficient documentation

## 2023-07-15 DIAGNOSIS — I10 Essential (primary) hypertension: Secondary | ICD-10-CM | POA: Insufficient documentation

## 2023-07-15 MED ORDER — PENICILLIN V POTASSIUM 250 MG PO TABS
500.0000 mg | ORAL_TABLET | Freq: Once | ORAL | Status: AC
  Administered 2023-07-15: 500 mg via ORAL
  Filled 2023-07-15: qty 2

## 2023-07-15 MED ORDER — OXYCODONE HCL 5 MG PO TABS
5.0000 mg | ORAL_TABLET | Freq: Four times a day (QID) | ORAL | 0 refills | Status: AC | PRN
  Filled 2023-07-15: qty 9, 3d supply, fill #0

## 2023-07-15 MED ORDER — IBUPROFEN 400 MG PO TABS
600.0000 mg | ORAL_TABLET | Freq: Once | ORAL | Status: AC
  Administered 2023-07-15: 600 mg via ORAL
  Filled 2023-07-15: qty 1

## 2023-07-15 MED ORDER — OXYCODONE-ACETAMINOPHEN 5-325 MG PO TABS
1.0000 | ORAL_TABLET | Freq: Once | ORAL | Status: DC
  Filled 2023-07-15: qty 1

## 2023-07-15 MED ORDER — PENICILLIN V POTASSIUM 500 MG PO TABS
500.0000 mg | ORAL_TABLET | Freq: Four times a day (QID) | ORAL | 0 refills | Status: AC
  Filled 2023-07-15: qty 28, 7d supply, fill #0

## 2023-07-15 NOTE — ED Provider Notes (Signed)
 [facility] EMERGENCY DEPARTMENT AT [facility] Provider Note   CSN: [id number] Arrival date & time: 07/15/23  9273     History  Chief Complaint  Patient presents with   Headache   Dental Pain    Randy Dunn is a 38 y.o. male.  Patient is a 38 year old male with past medical history of hypertension presenting to the emergency department with dental pain and headache.  Patient states that both his symptoms started around the same time and have been ongoing for the last 2 to 3 days.  He states that he had a little bit of swelling in his mouth last night but has since resolved.  He states that his dental pains on the left lower tooth and is radiating into his left ear.  He denies any fevers, nausea or vomiting.  He denies any vision changes, numbness or weakness.  He states that he took Tylenol  at home this morning with minimal relief.  The history is provided by the patient.  Headache Dental Pain Associated symptoms: headaches        Home Medications Prior to Admission medications   Medication Sig Start Date End Date Taking? Authorizing Provider  oxyCODONE  (ROXICODONE ) 5 MG immediate release tablet Take 1 tablet (5 mg total) by mouth every 6 (six) hours as needed. 07/15/23  Yes Kingsley, Shanin Szymanowski K, DO  penicillin  v potassium (VEETID) 500 MG tablet Take 1 tablet (500 mg total) by mouth 4 (four) times daily for 7 days. 07/15/23 07/22/23 Yes Ellouise, Kelton Bultman K, DO  losartan  (COZAAR ) 50 MG tablet Take 1 tablet (50 mg total) by mouth daily. Patient not taking: Reported on 02/16/2023 04/16/22 07/15/22  Vannie Reche RAMAN, NP  oxycodone -acetaminophen  (PERCOCET) 2.5-325 MG tablet Take 1 tablet by mouth every 4 (four) hours as needed for pain.    [provider]      Allergies    Patient has no known allergies.    Review of Systems   Review of Systems  Neurological:  Positive for headaches.    Physical Exam Updated Vital Signs BP (!) 149/106   Pulse 71   Temp 98.5  F (36.9 C)   Resp 18   Wt 131.5 kg   SpO2 97%   BMI 45.42 kg/m  Physical Exam Vitals and nursing note reviewed.  Constitutional:      General: He is not in acute distress.    Appearance: He is well-developed.  HENT:     Head: Normocephalic and atraumatic.     Right Ear: Tympanic membrane, ear canal and external ear normal.     Left Ear: Tympanic membrane, ear canal and external ear normal.     Mouth/Throat:     Mouth: Mucous membranes are moist.      Comments: No palpable fluctuance No submandibular swelling, no swelling to posterior oropharynx, tolerating secretion, normal phonation Eyes:     Extraocular Movements: Extraocular movements intact.     Pupils: Pupils are equal, round, and reactive to light.  Cardiovascular:     Rate and Rhythm: Normal rate.  Pulmonary:     Effort: Pulmonary effort is normal.  Abdominal:     Palpations: Abdomen is soft.  Musculoskeletal:        General: Normal range of motion.     Cervical back: Normal range of motion and neck supple.  Skin:    General: Skin is warm and dry.  Neurological:     Mental Status: He is alert and oriented to person, place, and time.  GCS: GCS eye subscore is 4. GCS verbal subscore is 5. GCS motor subscore is 6.     Cranial Nerves: No cranial nerve deficit or dysarthria.     Sensory: No sensory deficit.     Motor: No weakness.  Psychiatric:        Mood and Affect: Mood normal.        Speech: Speech normal.        Behavior: Behavior normal.     ED Results / Procedures / Treatments   Labs (all labs ordered are listed, but only abnormal results are displayed) Labs Reviewed - No data to display  EKG None  Radiology No results found.  Procedures Procedures    Medications Ordered in ED Medications  ibuprofen  (ADVIL ) tablet 600 mg (has no administration in time range)  oxyCODONE -acetaminophen  (PERCOCET/ROXICET) 5-325 MG per tablet 1 tablet (has no administration in time range)  penicillin  v  potassium (VEETID) tablet 500 mg (has no administration in time range)    ED Course/ Medical Decision Making/ A&P                                 Medical Decision Making This patient presents to the ED with chief complaint(s) of headache, dental pain with pertinent past medical history of HTN which further complicates the presenting complaint. The complaint involves an extensive differential diagnosis and also carries with it a high risk of complications and morbidity.    The differential diagnosis includes dental caries, dental abscess, no evidence of Ludwick's or other deep space infection, headache was not sudden onset, has no focal neurologic deficits making ICH or mass effect unlikely, no fever or meningismus making meningitis unlikely  Additional history obtained: Additional history obtained from N/A Records reviewed N/A  ED Course and Reassessment: On patient's arrival he is hemodynamically stable in no acute distress.  Does have tenderness to percussion of 2 of his molars on the left side of his mouth, no palpable fluctuance or drainable abscess visual on exam, no signs of deep space infection, no focal neurologic deficits and headache likely secondary to dental pain and early dental infection.  Patient is given pain control and antibiotics and was recommended close outpatient dental follow-up and was given strict return cautions.  Independent labs interpretation:  N/A  Independent visualization of imaging: - N/A  Consultation: - Consulted or discussed management/test interpretation w/ external professional: N/A  Consideration for admission or further workup: Patient has no emergent conditions requiring admission or further work-up at this time and is stable for discharge home with dental follow-up  Social Determinants of health: N/A    Risk Prescription drug management.          Final Clinical Impression(s) / ED Diagnoses Final diagnoses:  Pain, dental  Acute  nonintractable headache, unspecified headache type    Rx / DC Orders ED Discharge Orders          Ordered    penicillin  v potassium (VEETID) 500 MG tablet  4 times daily        07/15/23 0858    oxyCODONE  (ROXICODONE ) 5 MG immediate release tablet  Every 6 hours PRN        07/15/23 0858              Kingsley, Noris Kulinski K, DO 07/15/23 [phone]

## 2023-07-15 NOTE — Discharge Instructions (Signed)
 You were seen in the emergency department for your dental pain and headache.  You likely have an early dental infection I have given you a course of antibiotics he should complete this as prescribed.  You can continue to take Tylenol  and Motrin  every 6 hours as needed for pain I have given you oxycodone  for breakthrough pain.  This can make you drowsy so do not take it while driving, working or operating heavy machinery.  You should follow-up with a dentist in the next few days to have your symptoms rechecked.  You should return to the emergency department if you develop swelling underneath your chin, you have too much pain or swelling to go to open your mouth, you have fevers despite the antibiotics or if you have any other new or concerning symptoms.

## 2023-07-15 NOTE — ED Triage Notes (Signed)
 Pt c/o lower left dental pain and HA x 3 days. Tylenol reduces pain but pain returns. Tylenol 45 mins pta

## 2023-07-15 NOTE — ED Notes (Signed)
 Dc instructions reviewed with patient. Patient voiced understanding. Dc with belongings.
